# Patient Record
Sex: Female | Born: 1955
Health system: Southern US, Community
[De-identification: ages and names within clinical notes are randomized; demographics above are authoritative.]

## PROBLEM LIST (undated history)

## (undated) DIAGNOSIS — K219 Gastro-esophageal reflux disease without esophagitis: Secondary | ICD-10-CM

## (undated) DIAGNOSIS — F32A Depression, unspecified: Secondary | ICD-10-CM

## (undated) DIAGNOSIS — M1990 Unspecified osteoarthritis, unspecified site: Secondary | ICD-10-CM

## (undated) DIAGNOSIS — F329 Major depressive disorder, single episode, unspecified: Secondary | ICD-10-CM

## (undated) HISTORY — PX: TONSILLECTOMY: SUR1361

## (undated) HISTORY — DX: Unspecified osteoarthritis, unspecified site: M19.90

## (undated) HISTORY — PX: APPENDECTOMY: SHX54

---

## 1998-11-04 ENCOUNTER — Emergency Department (HOSPITAL_COMMUNITY): Admission: EM | Admit: 1998-11-04 | Discharge: 1998-11-04 | Payer: Self-pay | Admitting: Emergency Medicine

## 1998-11-04 ENCOUNTER — Encounter: Payer: Self-pay | Admitting: Emergency Medicine

## 1998-12-19 ENCOUNTER — Encounter: Admission: RE | Admit: 1998-12-19 | Discharge: 1999-03-19 | Payer: Self-pay | Admitting: Orthopedic Surgery

## 2012-01-15 ENCOUNTER — Emergency Department (HOSPITAL_COMMUNITY): Payer: Self-pay

## 2012-01-15 ENCOUNTER — Observation Stay (HOSPITAL_COMMUNITY)
Admission: EM | Admit: 2012-01-15 | Discharge: 2012-01-16 | Disposition: A | Discharge status: Home / Self Care | Payer: Self-pay | Attending: Surgery | Admitting: Surgery

## 2012-01-15 ENCOUNTER — Encounter (HOSPITAL_COMMUNITY): Payer: Self-pay | Admitting: Certified Registered Nurse Anesthetist

## 2012-01-15 ENCOUNTER — Encounter (HOSPITAL_COMMUNITY)
Admission: EM | Disposition: A | Discharge status: Home / Self Care | Payer: Self-pay | Source: Home / Self Care | Attending: Emergency Medicine

## 2012-01-15 ENCOUNTER — Emergency Department (HOSPITAL_COMMUNITY): Payer: Self-pay | Admitting: Certified Registered Nurse Anesthetist

## 2012-01-15 ENCOUNTER — Inpatient Hospital Stay: Admit: 2012-01-15 | Payer: Self-pay | Admitting: Surgery

## 2012-01-15 ENCOUNTER — Encounter (HOSPITAL_COMMUNITY): Payer: Self-pay | Admitting: Emergency Medicine

## 2012-01-15 ENCOUNTER — Encounter (HOSPITAL_COMMUNITY): Payer: Self-pay | Admitting: *Deleted

## 2012-01-15 DIAGNOSIS — F17200 Nicotine dependence, unspecified, uncomplicated: Secondary | ICD-10-CM | POA: Insufficient documentation

## 2012-01-15 DIAGNOSIS — R109 Unspecified abdominal pain: Secondary | ICD-10-CM

## 2012-01-15 DIAGNOSIS — K3580 Unspecified acute appendicitis: Secondary | ICD-10-CM | POA: Diagnosis present

## 2012-01-15 DIAGNOSIS — F329 Major depressive disorder, single episode, unspecified: Secondary | ICD-10-CM | POA: Insufficient documentation

## 2012-01-15 DIAGNOSIS — K219 Gastro-esophageal reflux disease without esophagitis: Secondary | ICD-10-CM | POA: Insufficient documentation

## 2012-01-15 DIAGNOSIS — K35209 Acute appendicitis with generalized peritonitis, without abscess, unspecified as to perforation: Principal | ICD-10-CM | POA: Insufficient documentation

## 2012-01-15 DIAGNOSIS — K352 Acute appendicitis with generalized peritonitis, without abscess: Secondary | ICD-10-CM | POA: Insufficient documentation

## 2012-01-15 DIAGNOSIS — F3289 Other specified depressive episodes: Secondary | ICD-10-CM | POA: Insufficient documentation

## 2012-01-15 HISTORY — PX: LAPAROSCOPIC APPENDECTOMY: SHX408

## 2012-01-15 HISTORY — DX: Depression, unspecified: F32.A

## 2012-01-15 HISTORY — DX: Unspecified acute appendicitis: K35.80

## 2012-01-15 HISTORY — DX: Gastro-esophageal reflux disease without esophagitis: K21.9

## 2012-01-15 HISTORY — DX: Major depressive disorder, single episode, unspecified: F32.9

## 2012-01-15 LAB — COMPREHENSIVE METABOLIC PANEL
AST: 16 U/L (ref 0–37)
Albumin: 3.5 g/dL (ref 3.5–5.2)
CO2: 24 mEq/L (ref 19–32)
Calcium: 9.3 mg/dL (ref 8.4–10.5)
Creatinine, Ser: 0.86 mg/dL (ref 0.50–1.10)
GFR calc non Af Amer: 74 mL/min — ABNORMAL LOW (ref >90)
Total Protein: 6.9 g/dL (ref 6.0–8.3)

## 2012-01-15 LAB — CBC WITH DIFFERENTIAL/PLATELET
Basophils Absolute: 0 10*3/uL (ref 0.0–0.1)
Basophils Relative: 0 % (ref 0–1)
Eosinophils Absolute: 0 10*3/uL (ref 0.0–0.7)
Eosinophils Relative: 0 % (ref 0–5)
HCT: 41.7 % (ref 36.0–46.0)
MCH: 30.1 pg (ref 26.0–34.0)
MCHC: 33.8 g/dL (ref 30.0–36.0)
MCV: 88.9 fL (ref 78.0–100.0)
Monocytes Absolute: 0.8 10*3/uL (ref 0.1–1.0)
RDW: 13.1 % (ref 11.5–15.5)

## 2012-01-15 LAB — URINALYSIS, ROUTINE W REFLEX MICROSCOPIC
Hgb urine dipstick: NEGATIVE
Specific Gravity, Urine: 1.015 (ref 1.005–1.030)
Urobilinogen, UA: 0.2 mg/dL (ref 0.0–1.0)

## 2012-01-15 LAB — POCT PREGNANCY, URINE: Preg Test, Ur: NEGATIVE

## 2012-01-15 SURGERY — APPENDECTOMY, LAPAROSCOPIC
Anesthesia: General | Site: Abdomen | Wound class: Clean Contaminated

## 2012-01-15 MED ORDER — LACTATED RINGERS IV SOLN
INTRAVENOUS | Status: DC | PRN
  Administered 2012-01-15 (×2): via INTRAVENOUS

## 2012-01-15 MED ORDER — HYDROMORPHONE HCL PF 1 MG/ML IJ SOLN
1.0000 mg | Freq: Once | INTRAMUSCULAR | Status: AC
  Administered 2012-01-15: 1 mg via INTRAVENOUS
  Filled 2012-01-15: qty 1

## 2012-01-15 MED ORDER — ONDANSETRON HCL 4 MG/2ML IJ SOLN: 4.0000 mg | Freq: Four times a day (QID) | INTRAMUSCULAR | Status: DC | PRN

## 2012-01-15 MED ORDER — OXYCODONE HCL 5 MG PO TABS: 5.0000 mg | ORAL_TABLET | Freq: Once | ORAL | Status: DC | PRN

## 2012-01-15 MED ORDER — SODIUM CHLORIDE 0.9 % IV SOLN
Freq: Once | INTRAVENOUS | Status: AC
  Administered 2012-01-15: 14:00:00 via INTRAVENOUS

## 2012-01-15 MED ORDER — PAROXETINE HCL 30 MG PO TABS
60.0000 mg | ORAL_TABLET | Freq: Every day | ORAL | Status: DC
  Administered 2012-01-16: 60 mg via ORAL
  Filled 2012-01-15 (×2): qty 2

## 2012-01-15 MED ORDER — EPHEDRINE SULFATE 50 MG/ML IJ SOLN
INTRAMUSCULAR | Status: DC | PRN
  Administered 2012-01-15: 10 mg via INTRAVENOUS

## 2012-01-15 MED ORDER — 0.9 % SODIUM CHLORIDE (POUR BTL) OPTIME
TOPICAL | Status: DC | PRN
  Administered 2012-01-15: 1000 mL

## 2012-01-15 MED ORDER — MORPHINE SULFATE 4 MG/ML IJ SOLN
1.0000 mg | INTRAMUSCULAR | Status: DC | PRN
  Administered 2012-01-15: 4 mg via INTRAVENOUS
  Filled 2012-01-15: qty 1

## 2012-01-15 MED ORDER — MORPHINE SULFATE 4 MG/ML IJ SOLN
4.0000 mg | INTRAMUSCULAR | Status: DC | PRN
  Administered 2012-01-16: 4 mg via INTRAVENOUS
  Filled 2012-01-15: qty 1

## 2012-01-15 MED ORDER — SUCCINYLCHOLINE CHLORIDE 20 MG/ML IJ SOLN
INTRAMUSCULAR | Status: DC | PRN
  Administered 2012-01-15: 120 mg via INTRAVENOUS

## 2012-01-15 MED ORDER — DEXAMETHASONE SODIUM PHOSPHATE 4 MG/ML IJ SOLN
INTRAMUSCULAR | Status: DC | PRN
  Administered 2012-01-15: 4 mg via INTRAVENOUS

## 2012-01-15 MED ORDER — ONDANSETRON HCL 4 MG PO TABS: 4.0000 mg | ORAL_TABLET | Freq: Four times a day (QID) | ORAL | Status: DC | PRN

## 2012-01-15 MED ORDER — SODIUM CHLORIDE 0.9 % IV SOLN
1.0000 g | Freq: Once | INTRAVENOUS | Status: AC
  Administered 2012-01-15 (×2): 1 g via INTRAVENOUS
  Filled 2012-01-15 (×2): qty 1

## 2012-01-15 MED ORDER — OXYCODONE-ACETAMINOPHEN 5-325 MG PO TABS
1.0000 | ORAL_TABLET | ORAL | Status: DC | PRN
  Administered 2012-01-16: 2 via ORAL
  Filled 2012-01-15: qty 2

## 2012-01-15 MED ORDER — HYDROXYZINE HCL 25 MG PO TABS
25.0000 mg | ORAL_TABLET | Freq: Three times a day (TID) | ORAL | Status: DC | PRN
  Administered 2012-01-16 (×2): 25 mg via ORAL
  Filled 2012-01-15 (×3): qty 1

## 2012-01-15 MED ORDER — GLYCOPYRROLATE 0.2 MG/ML IJ SOLN
INTRAMUSCULAR | Status: DC | PRN
  Administered 2012-01-15: 0.4 mg via INTRAVENOUS

## 2012-01-15 MED ORDER — KCL IN DEXTROSE-NACL 20-5-0.45 MEQ/L-%-% IV SOLN
INTRAVENOUS | Status: DC
  Administered 2012-01-16: via INTRAVENOUS
  Filled 2012-01-15 (×2): qty 1000

## 2012-01-15 MED ORDER — BUSPIRONE HCL 10 MG PO TABS
10.0000 mg | ORAL_TABLET | Freq: Three times a day (TID) | ORAL | Status: DC
  Filled 2012-01-15: qty 1

## 2012-01-15 MED ORDER — PAROXETINE HCL 30 MG PO TABS
60.0000 mg | ORAL_TABLET | Freq: Every day | ORAL | Status: DC
  Filled 2012-01-15 (×2): qty 2

## 2012-01-15 MED ORDER — LIDOCAINE HCL 4 % MT SOLN
OROMUCOSAL | Status: DC | PRN
  Administered 2012-01-15: 4 mL via TOPICAL

## 2012-01-15 MED ORDER — KETOROLAC TROMETHAMINE 30 MG/ML IJ SOLN
30.0000 mg | Freq: Four times a day (QID) | INTRAMUSCULAR | Status: DC
  Administered 2012-01-15 – 2012-01-16 (×3): 30 mg via INTRAVENOUS
  Filled 2012-01-15 (×6): qty 1

## 2012-01-15 MED ORDER — BUSPIRONE HCL 10 MG PO TABS
10.0000 mg | ORAL_TABLET | Freq: Three times a day (TID) | ORAL | Status: DC
  Administered 2012-01-16 (×2): 10 mg via ORAL
  Filled 2012-01-15 (×4): qty 1

## 2012-01-15 MED ORDER — SODIUM CHLORIDE 0.9 % IV SOLN
1.0000 g | INTRAVENOUS | Status: DC | PRN
  Administered 2012-01-15: 1 g via INTRAVENOUS

## 2012-01-15 MED ORDER — IOHEXOL 300 MG/ML  SOLN
100.0000 mL | Freq: Once | INTRAMUSCULAR | Status: AC | PRN
  Administered 2012-01-15: 100 mL via INTRAVENOUS

## 2012-01-15 MED ORDER — IOHEXOL 300 MG/ML  SOLN
20.0000 mL | INTRAMUSCULAR | Status: AC
  Administered 2012-01-15 (×2): 20 mL via ORAL

## 2012-01-15 MED ORDER — ONDANSETRON HCL 4 MG/2ML IJ SOLN
4.0000 mg | INTRAMUSCULAR | Status: DC | PRN
  Administered 2012-01-15: 4 mg via INTRAVENOUS
  Filled 2012-01-15: qty 2

## 2012-01-15 MED ORDER — PROMETHAZINE HCL 25 MG/ML IJ SOLN: 6.2500 mg | INTRAMUSCULAR | Status: DC | PRN

## 2012-01-15 MED ORDER — PROPOFOL 10 MG/ML IV BOLUS
INTRAVENOUS | Status: DC | PRN
  Administered 2012-01-15: 150 mg via INTRAVENOUS

## 2012-01-15 MED ORDER — OXYCODONE HCL 5 MG/5ML PO SOLN: 5.0000 mg | Freq: Once | ORAL | Status: DC | PRN

## 2012-01-15 MED ORDER — SODIUM CHLORIDE 0.9 % IR SOLN
Status: DC | PRN
  Administered 2012-01-15: 1000 mL

## 2012-01-15 MED ORDER — NEOSTIGMINE METHYLSULFATE 1 MG/ML IJ SOLN
INTRAMUSCULAR | Status: DC | PRN
  Administered 2012-01-15: 3 mg via INTRAVENOUS

## 2012-01-15 MED ORDER — BUPIVACAINE-EPINEPHRINE 0.25% -1:200000 IJ SOLN
INTRAMUSCULAR | Status: DC | PRN
  Administered 2012-01-15: 9 mL

## 2012-01-15 MED ORDER — SUFENTANIL CITRATE 50 MCG/ML IV SOLN
INTRAVENOUS | Status: DC | PRN
  Administered 2012-01-15 (×2): 10 ug via INTRAVENOUS

## 2012-01-15 MED ORDER — ONDANSETRON HCL 4 MG/2ML IJ SOLN
4.0000 mg | Freq: Once | INTRAMUSCULAR | Status: AC
  Administered 2012-01-15: 4 mg via INTRAVENOUS
  Filled 2012-01-15: qty 2

## 2012-01-15 MED ORDER — MIDAZOLAM HCL 5 MG/5ML IJ SOLN
INTRAMUSCULAR | Status: DC | PRN
  Administered 2012-01-15: 2 mg via INTRAVENOUS

## 2012-01-15 MED ORDER — ACETAMINOPHEN 325 MG PO TABS
650.0000 mg | ORAL_TABLET | ORAL | Status: DC | PRN
  Administered 2012-01-16: 650 mg via ORAL
  Filled 2012-01-15: qty 2

## 2012-01-15 MED ORDER — HYDROMORPHONE HCL PF 1 MG/ML IJ SOLN
0.2500 mg | INTRAMUSCULAR | Status: DC | PRN
  Administered 2012-01-15 (×2): 0.5 mg via INTRAVENOUS

## 2012-01-15 MED ORDER — ONDANSETRON HCL 4 MG/2ML IJ SOLN
INTRAMUSCULAR | Status: DC | PRN
  Administered 2012-01-15: 4 mg via INTRAVENOUS

## 2012-01-15 MED ORDER — ROCURONIUM BROMIDE 100 MG/10ML IV SOLN
INTRAVENOUS | Status: DC | PRN
  Administered 2012-01-15: 20 mg via INTRAVENOUS

## 2012-01-15 MED ORDER — ARTIFICIAL TEARS OP OINT
TOPICAL_OINTMENT | OPHTHALMIC | Status: DC | PRN
  Administered 2012-01-15: 1 via OPHTHALMIC

## 2012-01-15 SURGICAL SUPPLY — 48 items
APL SKNCLS STERI-STRIP NONHPOA (GAUZE/BANDAGES/DRESSINGS) ×1
APPLIER CLIP ROT 10 11.4 M/L (STAPLE)
APR CLP MED LRG 11.4X10 (STAPLE)
BAG SPEC RTRVL LRG 6X4 10 (ENDOMECHANICALS) ×1
BENZOIN TINCTURE PRP APPL 2/3 (GAUZE/BANDAGES/DRESSINGS) ×2 IMPLANT
BLADE SURG ROTATE 9660 (MISCELLANEOUS) IMPLANT
CANISTER SUCTION 2500CC (MISCELLANEOUS) ×2 IMPLANT
CHLORAPREP W/TINT 26ML (MISCELLANEOUS) ×2 IMPLANT
CLIP APPLIE ROT 10 11.4 M/L (STAPLE) IMPLANT
CLOTH BEACON ORANGE TIMEOUT ST (SAFETY) ×2 IMPLANT
CLSR STERI-STRIP ANTIMIC 1/2X4 (GAUZE/BANDAGES/DRESSINGS) ×1 IMPLANT
COVER SURGICAL LIGHT HANDLE (MISCELLANEOUS) ×2 IMPLANT
CUTTER FLEX LINEAR 45M (STAPLE) ×2 IMPLANT
DECANTER SPIKE VIAL GLASS SM (MISCELLANEOUS) ×2 IMPLANT
DRAPE UTILITY 15X26 W/TAPE STR (DRAPE) ×4 IMPLANT
DRSG TEGADERM 4X4.75 (GAUZE/BANDAGES/DRESSINGS) ×1 IMPLANT
ELECT REM PT RETURN 9FT ADLT (ELECTROSURGICAL) ×2
ELECTRODE REM PT RTRN 9FT ADLT (ELECTROSURGICAL) ×1 IMPLANT
ENDOLOOP SUT PDS II  0 18 (SUTURE)
ENDOLOOP SUT PDS II 0 18 (SUTURE) IMPLANT
FILTER SMOKE EVAC LAPAROSHD (FILTER) ×2 IMPLANT
GAUZE SPONGE 2X2 8PLY STRL LF (GAUZE/BANDAGES/DRESSINGS) ×1 IMPLANT
GLOVE BIO SURGEON STRL SZ7 (GLOVE) ×2 IMPLANT
GLOVE BIO SURGEON STRL SZ7.5 (GLOVE) ×1 IMPLANT
GLOVE BIOGEL PI IND STRL 7.5 (GLOVE) ×1 IMPLANT
GLOVE BIOGEL PI IND STRL 8 (GLOVE) IMPLANT
GLOVE BIOGEL PI INDICATOR 7.5 (GLOVE) ×1
GLOVE BIOGEL PI INDICATOR 8 (GLOVE) ×1
GOWN STRL NON-REIN LRG LVL3 (GOWN DISPOSABLE) ×6 IMPLANT
KIT BASIN OR (CUSTOM PROCEDURE TRAY) ×2 IMPLANT
KIT ROOM TURNOVER OR (KITS) ×2 IMPLANT
NS IRRIG 1000ML POUR BTL (IV SOLUTION) ×2 IMPLANT
PAD ARMBOARD 7.5X6 YLW CONV (MISCELLANEOUS) ×4 IMPLANT
POUCH SPECIMEN RETRIEVAL 10MM (ENDOMECHANICALS) ×2 IMPLANT
RELOAD STAPLE 45 3.5 BLU ETS (ENDOMECHANICALS) ×1 IMPLANT
RELOAD STAPLE TA45 3.5 REG BLU (ENDOMECHANICALS) ×2 IMPLANT
SCALPEL HARMONIC ACE (MISCELLANEOUS) ×2 IMPLANT
SET IRRIG TUBING LAPAROSCOPIC (IRRIGATION / IRRIGATOR) ×2 IMPLANT
SPECIMEN JAR SMALL (MISCELLANEOUS) ×2 IMPLANT
SPONGE GAUZE 2X2 STER 10/PKG (GAUZE/BANDAGES/DRESSINGS) ×1
SUT MNCRL AB 4-0 PS2 18 (SUTURE) ×2 IMPLANT
TOWEL OR 17X24 6PK STRL BLUE (TOWEL DISPOSABLE) ×2 IMPLANT
TOWEL OR 17X26 10 PK STRL BLUE (TOWEL DISPOSABLE) ×2 IMPLANT
TRAY FOLEY CATH 14FR (SET/KITS/TRAYS/PACK) ×2 IMPLANT
TRAY LAPAROSCOPIC (CUSTOM PROCEDURE TRAY) ×2 IMPLANT
TROCAR XCEL BLADELESS 5X75MML (TROCAR) ×4 IMPLANT
TROCAR XCEL BLUNT TIP 100MML (ENDOMECHANICALS) ×2 IMPLANT
WATER STERILE IRR 1000ML POUR (IV SOLUTION) IMPLANT

## 2012-01-15 NOTE — ED Provider Notes (Signed)
Patient care assumed from [facility], [place]. Patient moved to CDU awaiting CT scan abdomen/pelvis. Currently patient in room laying in bed stating her pain is 5/10. Denies nausea. Already drank contrast for CT scan. On exam patient is laying in bed and appears uncomfortable. HENT negative. Heart RRR. Lungs CTA bilateral. Abdomen soft with normal bowel sounds, TTP of right side of abdomen, worse in RLQ. Positive guarding. No peritoneal signs. AAOx3.  Phone call from radiologist stating patient either has acute appendicitis or inguinal hernia with small bowel through non obstructed. Surgery will be consulted.  Ct Abdomen Pelvis W Contrast  01/15/2012  *RADIOLOGY REPORT*  Clinical Data: Generalized abdominal pain  CT ABDOMEN AND PELVIS WITH CONTRAST  Technique:  Multidetector CT imaging of the abdomen and pelvis was performed following the standard protocol during bolus administration of intravenous contrast.  Contrast: OMNIPAQUE IOHEXOL 300 MG/ML  SOLN  Comparison: None.  Findings: Minimal dependent atelectasis at the lung bases.  Liver, spleen, pancreas, and adrenal glands are within normal limits.  Gallbladder is unremarkable.  No intrahepatic or extrahepatic ductal dilatation.  3 mm nonobstructing right lower pole renal calculus (series 2/image 38).  4.2 x 3.3 cm interpolar left renal cyst (series 2/image 38). No hydronephrosis.  No evidence of bowel obstruction. Appendix is mildly prominent, measuring up to 9 mm (series 2/image 65).  Suspected mild mucosal hyperenhancement with possible very early periappendiceal inflammatory changes (coronal image 27).  While not definitive, this appearance is worrisome for early acute appendicitis.  Atherosclerotic calcifications of the abdominal aorta and branch vessels.  No bone pelvic ascites.  No suspicious abdominopelvic lymphadenopathy.  Uterus and bilateral ovaries unremarkable.  Bladder is within normal limits.  Small right inguinal hernia containing a  knuckle of nondilated small bowel (series 2/image 77).  Mild degenerative changes of the visualized thoracolumbar spine. Grade 1 spondylolisthesis at L5-S1.  IMPRESSION: Findings worrisome for early acute appendicitis.  Small right inguinal hernia containing a knuckle of nondilated small bowel.  No evidence of bowel obstruction.   Original Report Authenticated By: Charline Bills, M.D.    Case discussed with Doran Durand, PA-C who will take over care of patient at this time.  Trevor Mace, PA-C 01/15/12 1551

## 2012-01-15 NOTE — ED Notes (Signed)
Pt c/o generalized abd pain and back pain x 2 days; pt sts shaking and chills

## 2012-01-15 NOTE — ED Provider Notes (Signed)
Patient has been seen and evaluated by surgery.  Patient will go to OR tonight.  Jimmye Norman, NP 01/15/12 2240

## 2012-01-15 NOTE — H&P (Signed)
Shirley Williams 01-31-1956  [id number].   Primary Care MD: Physician in [place] Chief Complaint/Reason for Consult: abdominal pain HPI: this is a 56 yo female who began having RLQ abdominal pain last night around 2000.  It started in the RLQ and doesn't radiate anywhere.  She denies any other symptoms except pain.  No nausea, vomiting, diarrhea, chills, fevers, chest pain, or SOB.  The only other complaint she has is a headache.  Due to pain, she came to the [facility] today where she had a CT scan that revealed early appendicitis.    Review of Systems: Please see HPI, otherwise all other systems are negative  History reviewed. No pertinent family history.  Past Medical History  Diagnosis Date  . Depression   . GERD (gastroesophageal reflux disease)     Past Surgical History  Procedure Date  . Tonsillectomy     Social History:  reports that she has been smoking.  She does not have any smokeless tobacco history on file. She reports that she drinks alcohol. She reports that she does not use illicit drugs.  Allergies:  Allergies  Allergen Reactions  . Sulfa Antibiotics      (Not in a hospital admission)  Blood pressure 108/68, pulse 71, temperature 98.4 F (36.9 C), temperature source Oral, resp. rate 18, last menstrual period 12/04/2011, SpO2 99.00%. Physical Exam: General: WD, WN white female who is laying in bed in NAD HEENT: head is normocephalic, atraumatic.  Sclera are noninjected.  PERRL.  Ears and nose without any masses or lesions.  Mouth is pink and moist Heart: regular, rate, and rhythm.  Normal s1,s2. No obvious murmurs, gallops, or rubs noted.  Palpable radial and pedal pulses bilaterally Lungs: CTAB, no wheezes, rhonchi, or rales noted.  Respiratory effort nonlabored Abd: soft, tender in RLQ over McBurney's point, ND, +BS, no masses, organomegaly.  Right inguinal hernia must be reduced as I am unable to currently palpate it. MS: all 4 extremities are symmetrical with no  cyanosis, clubbing, or edema. Skin: warm and dry with no masses, lesions, or rashes Psych: A&Ox3 with a flighty disposition    Results for orders placed during the hospital encounter of 01/15/12 (from the past 48 hour(s))  CBC WITH DIFFERENTIAL     Status: Abnormal   Collection Time   01/15/12 12:39 PM      Component Value Range Comment   WBC 14.0 (*) 4.0 - 10.5 K/uL    RBC 4.69  3.87 - 5.11 MIL/uL    Hemoglobin 14.1  12.0 - 15.0 g/dL    HCT 08.6  57.8 - 46.9 %    MCV 88.9  78.0 - 100.0 fL    MCH 30.1  26.0 - 34.0 pg    MCHC 33.8  30.0 - 36.0 g/dL    RDW 62.9  52.8 - 41.3 %    Platelets 204  150 - 400 K/uL    Neutrophils Relative 90 (*) 43 - 77 %    Neutro Abs 12.5 (*) 1.7 - 7.7 K/uL    Lymphocytes Relative 4 (*) 12 - 46 %    Lymphs Abs 0.6 (*) 0.7 - 4.0 K/uL    Monocytes Relative 6  3 - 12 %    Monocytes Absolute 0.8  0.1 - 1.0 K/uL    Eosinophils Relative 0  0 - 5 %    Eosinophils Absolute 0.0  0.0 - 0.7 K/uL    Basophils Relative 0  0 - 1 %    Basophils Absolute  0.0  0.0 - 0.1 K/uL   COMPREHENSIVE METABOLIC PANEL     Status: Abnormal   Collection Time   01/15/12 12:39 PM      Component Value Range Comment   Sodium 138  135 - 145 mEq/L    Potassium 3.6  3.5 - 5.1 mEq/L    Chloride 104  96 - 112 mEq/L    CO2 24  19 - 32 mEq/L    Glucose, Bld 124 (*) 70 - 99 mg/dL    BUN 14  6 - 23 mg/dL    Creatinine, Ser 1.61  0.50 - 1.10 mg/dL    Calcium 9.3  8.4 - 09.6 mg/dL    Total Protein 6.9  6.0 - 8.3 g/dL    Albumin 3.5  3.5 - 5.2 g/dL    AST 16  0 - 37 U/L    ALT 10  0 - 35 U/L    Alkaline Phosphatase 59  39 - 117 U/L    Total Bilirubin 0.6  0.3 - 1.2 mg/dL    GFR calc non Af Amer 74 (*) >90 mL/min    GFR calc Af Amer 85 (*) >90 mL/min   URINALYSIS, ROUTINE W REFLEX MICROSCOPIC     Status: Normal   Collection Time   01/15/12  1:03 PM      Component Value Range Comment   Color, Urine YELLOW  YELLOW    APPearance CLEAR  CLEAR    Specific Gravity, Urine 1.015  1.005 -  1.030    pH 6.0  5.0 - 8.0    Glucose, UA NEGATIVE  NEGATIVE mg/dL    Hgb urine dipstick NEGATIVE  NEGATIVE    Bilirubin Urine NEGATIVE  NEGATIVE    Ketones, ur NEGATIVE  NEGATIVE mg/dL    Protein, ur NEGATIVE  NEGATIVE mg/dL    Urobilinogen, UA 0.2  0.0 - 1.0 mg/dL    Nitrite NEGATIVE  NEGATIVE    Leukocytes, UA NEGATIVE  NEGATIVE MICROSCOPIC NOT DONE ON URINES WITH NEGATIVE PROTEIN, BLOOD, LEUKOCYTES, NITRITE, OR GLUCOSE <1000 mg/dL.  POCT PREGNANCY, URINE     Status: Normal   Collection Time   01/15/12  1:08 PM      Component Value Range Comment   Preg Test, Ur NEGATIVE  NEGATIVE    Ct Abdomen Pelvis W Contrast  01/15/2012  *RADIOLOGY REPORT*  Clinical Data: Generalized abdominal pain  CT ABDOMEN AND PELVIS WITH CONTRAST  Technique:  Multidetector CT imaging of the abdomen and pelvis was performed following the standard protocol during bolus administration of intravenous contrast.  Contrast: OMNIPAQUE IOHEXOL 300 MG/ML  SOLN  Comparison: None.  Findings: Minimal dependent atelectasis at the lung bases.  Liver, spleen, pancreas, and adrenal glands are within normal limits.  Gallbladder is unremarkable.  No intrahepatic or extrahepatic ductal dilatation.  3 mm nonobstructing right lower pole renal calculus (series 2/image 38).  4.2 x 3.3 cm interpolar left renal cyst (series 2/image 38). No hydronephrosis.  No evidence of bowel obstruction. Appendix is mildly prominent, measuring up to 9 mm (series 2/image 65).  Suspected mild mucosal hyperenhancement with possible very early periappendiceal inflammatory changes (coronal image 27).  While not definitive, this appearance is worrisome for early acute appendicitis.  Atherosclerotic calcifications of the abdominal aorta and branch vessels.  No bone pelvic ascites.  No suspicious abdominopelvic lymphadenopathy.  Uterus and bilateral ovaries unremarkable.  Bladder is within normal limits.  Small right inguinal hernia containing a knuckle of  nondilated small bowel (series  2/image 77).  Mild degenerative changes of the visualized thoracolumbar spine. Grade 1 spondylolisthesis at L5-S1.  IMPRESSION: Findings worrisome for early acute appendicitis.  Small right inguinal hernia containing a knuckle of nondilated small bowel.  No evidence of bowel obstruction.   Original Report Authenticated By: Charline Bills, M.D.        Assessment/Plan 1. Early acute appendicitis 2. Right inguinal hernia, reduced 3. Major Depression  Plan: 1. The patient will be admitted and taken to the operating room for an appendectomy.  Her RIH appears to be reduced and she is nontender in this area.  She is acutely tender over McBurney's point, c/w acute appendicitis as opposed to pain secondary to this hernia.  She will be given Invanz.  I have explained the procedure in detail, as well as possible risks and complications, including bleeding, infection, abscess, or open procedure. I also discussed her expected course for recovery.  Younique Casad E 01/15/2012, 4:21 PM Pager: [phone]

## 2012-01-15 NOTE — Op Note (Signed)
Appendectomy, Lap, Procedure Note  Indications: The patient presented with a history of right-sided abdominal pain. A CT scan revealed findings consistent with acute appendicitis, but no sign of perforation.  Pre-operative Diagnosis: Acute appendicitis with generalized peritonitis  Post-operative Diagnosis: Same  Surgeon: Braden Deloach K.   Assistants: none  Anesthesia: General endotracheal anesthesia  ASA Class: 2E  Procedure Details  The patient was seen again in the Holding Room. The risks, benefits, complications, treatment options, and expected outcomes were discussed with the patient and/or family. The possibilities of reaction to medication, perforation of viscus, bleeding, recurrent infection, finding a normal appendix, the need for additional procedures, failure to diagnose a condition, and creating a complication requiring transfusion or operation were discussed. There was concurrence with the proposed plan and informed consent was obtained. The site of surgery was properly noted. The patient was taken to Operating Room, identified as Shirley Williams and the procedure verified as Appendectomy. A Time Out was held and the above information confirmed.  The patient was placed in the supine position and general anesthesia was induced.  The abdomen was prepped and draped in a sterile fashion. A one centimeter supraumbilical incision was made.  Dissection was carried down to the fascia bluntly.  The fascia was incised vertically.  We entered the peritoneal cavity bluntly.  A pursestring suture was passed around the incision with a 0 Vicryl.  The Hasson cannula was introduced into the abdomen and the tails of the suture were used to hold the Hasson in place.   The pneumoperitoneum was then established maintaining a maximum pressure of 15 mmHg.  Additional 5 mm cannulas then placed in the left lower quadrant of the abdomen and the right upper quadrant under direct visualization. A careful  evaluation of the entire abdomen was carried out. The patient was placed in Trendelenburg and left lateral decubitus position.  The scope was moved to the right upper quadrant port site. The cecum was mobilized medially.  The appendix was identified and was inflamed at the tip, but there was no sign of perforation.  The base of the appendix appeared normal. The appendix was carefully dissected. The appendix was was skeletonized with the harmonic scalpel.   The appendix was divided at its base using an endo-GIA stapler. No appendiceal stump was left in place. There was no evidence of bleeding, leakage, or complication after division of the appendix. Irrigation was also performed and irrigate suctioned from the abdomen as well.  We examined the right side of the pelvis.  The CT scan had commented on a right inguinal hernia containing small bowel, but there was no sign of any small bowel incarceration.  There was a very small shallow inguinal defect medial to the inferior epigastric artery, but no sign of incarceration.  The umbilical port site was closed with the purse string suture. There was no residual palpable fascial defect.  The trocar site skin wounds were closed with 4-0 Monocryl.  Instrument, sponge, and needle counts were correct at the conclusion of the case.   Findings: The appendix was found to be inflamed. There were not signs of necrosis.  There was not perforation. There was not abscess formation.  Estimated Blood Loss:  Minimal         Drains: none         Specimens: appendix         Complications:  None; patient tolerated the procedure well.         Disposition: PACU - hemodynamically stable.  Condition: stable  Wilmon Arms. Corliss Skains, MD, [facility]  01/15/2012 8:10 PM

## 2012-01-15 NOTE — ED Provider Notes (Signed)
History     CSN: [id number]  Arrival date & time 01/15/12  1051   First MD Initiated Contact with Patient 01/15/12 1150      Chief Complaint  Patient presents with  . Abdominal Pain  . Back Pain    (Consider location/radiation/quality/duration/timing/severity/associated sxs/prior treatment) HPI Comments: Patient with no history of abdominal surgeries presents with complaint of generalized abdominal pain, shaking, myalgias for the past 2-3 days. The abdominal pain is described as cramping that is worse in the lower abdomen. It is not associated with vaginal bleeding or discharge. No urinary symptoms. Patient is 56 years old however she states that she still has irregular periods with spotting at times. She denies fever, cold symptoms, shortness of breath, cough, nausea, vomiting, or diarrhea. Patient states she took one of her husband's Vicodin last night to help her sleep. Onset gradual. Course is constant. Pain does not radiate.   Patient is a 56 y.o. female presenting with abdominal pain and back pain. The history is provided by the patient.  Abdominal Pain The primary symptoms of the illness include abdominal pain. The primary symptoms of the illness do not include fever, shortness of breath, nausea, vomiting, diarrhea, dysuria, vaginal discharge or vaginal bleeding.  Additional symptoms associated with the illness include back pain.  Back Pain  Associated symptoms include headaches and abdominal pain. Pertinent negatives include no chest pain, no fever and no dysuria.    Past Medical History  Diagnosis Date  . Depression     History reviewed. No pertinent past surgical history.  History reviewed. No pertinent family history.  History  Substance Use Topics  . Smoking status: Current Every Day Smoker  . Smokeless tobacco: Not on file  . Alcohol Use: Yes     Comment: occ    OB History    Grav Para Term Preterm Abortions TAB SAB Ect Mult Living                   Review of Systems  Constitutional: Negative for fever.  HENT: Negative for sore throat and rhinorrhea.   Eyes: Negative for redness.  Respiratory: Negative for cough and shortness of breath.   Cardiovascular: Negative for chest pain.  Gastrointestinal: Positive for abdominal pain. Negative for nausea, vomiting and diarrhea.  Genitourinary: Negative for dysuria, vaginal bleeding and vaginal discharge.  Musculoskeletal: Positive for myalgias and back pain.  Skin: Negative for rash.  Neurological: Positive for tremors and headaches.    Allergies  Sulfa antibiotics  Home Medications   Current Outpatient Rx  Name  Route  Sig  Dispense  Refill  . GLUCOSAMINE COMPLEX PO   Oral   Take 1 tablet by mouth daily.         . BUSPIRONE HCL 10 MG PO TABS   Oral   Take 10 mg by mouth 3 (three) times daily.         Marland Kitchen CALCIUM-MAGNESIUM-ZINC PO   Oral   Take 1 tablet by mouth daily.         Marland Kitchen FLUTICASONE FUROATE 27.5 MCG/SPRAY NA SUSP   Nasal   Place 2 sprays into the nose daily as needed. For nasal congestion         . FOLIC ACID 1 MG PO TABS   Oral   Take 1 mg by mouth daily.         Marland Kitchen HYDROXYZINE HCL 25 MG PO TABS   Oral   Take 25 mg by mouth 3 (three) times daily  as needed. For nerves         . ALLERGY EYE DROPS OP   Ophthalmic   Apply 1-2 drops to eye every 2 (two) hours as needed. For dry eyes         . ADULT MULTIVITAMIN W/MINERALS CH   Oral   Take 1 tablet by mouth daily.         Marland Kitchen PAROXETINE HCL 20 MG PO TABS   Oral   Take 60 mg by mouth daily.         . TRAZODONE HCL 100 MG PO TABS   Oral   Take 100 mg by mouth at bedtime.         Marland Kitchen VITAMIN A PO   Oral   Take 1 capsule by mouth daily.           BP 117/75  Pulse 97  Temp 98.7 F (37.1 C) (Oral)  Resp 18  SpO2 97%  Physical Exam  Nursing note and vitals reviewed. Constitutional: She appears well-developed and well-nourished.  HENT:  Head: Normocephalic and atraumatic.   Eyes: Conjunctivae normal are normal. Right eye exhibits no discharge. Left eye exhibits no discharge.  Neck: Normal range of motion. Neck supple.  Cardiovascular: Normal rate, regular rhythm and normal heart sounds.   Pulmonary/Chest: Effort normal and breath sounds normal.  Abdominal: Soft. Bowel sounds are normal. There is generalized tenderness. There is no rigidity, no rebound, no guarding, no CVA tenderness, no tenderness at McBurney's point and negative Murphy's sign.       Abd pain, favors lower right abd.   Neurological: She is alert.  Skin: Skin is warm and dry.  Psychiatric: She has a normal mood and affect.    ED Course  Procedures (including critical care time)  Labs Reviewed  CBC WITH DIFFERENTIAL - Abnormal; Notable for the following:    WBC 14.0 (*)     Neutrophils Relative 90 (*)     Neutro Abs 12.5 (*)     Lymphocytes Relative 4 (*)     Lymphs Abs 0.6 (*)     All other components within normal limits  COMPREHENSIVE METABOLIC PANEL - Abnormal; Notable for the following:    Glucose, Bld 124 (*)     GFR calc non Af Amer 74 (*)     GFR calc Af Amer 85 (*)     All other components within normal limits  URINALYSIS, ROUTINE W REFLEX MICROSCOPIC  POCT PREGNANCY, URINE   No results found.   No diagnosis found.  12:26 PM Patient seen and examined. Work-up initiated. Medications ordered.   Vital signs reviewed and are as follows: Filed Vitals:   01/15/12 1106  BP: 117/75  Pulse: 97  Temp: 98.7 F (37.1 C)  Resp: 18   2:02 PM Patient seen. Pain improved. Still has RLQ tenderness on exam. Will move to the CDU. CT abd/pelvis ordered. Handoff to [employer] who will dispo per results.    MDM  To CDU pending CT abd/pelvis.         Renne Crigler, [place] 01/15/12 1407

## 2012-01-15 NOTE — Transfer of Care (Signed)
Immediate Anesthesia Transfer of Care Note  Patient: Shirley Williams  Procedure(s) Performed: Procedure(s) (LRB) with comments: APPENDECTOMY LAPAROSCOPIC (N/A)  Patient Location: PACU  Anesthesia Type:General  Level of Consciousness: awake, alert , oriented and patient cooperative  Airway & Oxygen Therapy: Patient Spontanous Breathing and Patient connected to nasal cannula oxygen  Post-op Assessment: Report given to PACU RN, Post -op Vital signs reviewed and stable and Patient moving all extremities X 4  Post vital signs: Reviewed and stable  Complications: No apparent anesthesia complications

## 2012-01-15 NOTE — Anesthesia Preprocedure Evaluation (Signed)
Anesthesia Evaluation  Patient identified by MRN, date of birth, ID band Patient awake    Reviewed: Allergy & Precautions, H&P , NPO status , Patient's Chart, lab work & pertinent test results  History of Anesthesia Complications Negative for: history of anesthetic complications  Airway Mallampati: II TM Distance: >3 FB Neck ROM: Full    Dental  (+) Loose   Pulmonary Current Smoker,    Pulmonary exam normal       Cardiovascular negative cardio ROS      Neuro/Psych Depression    GI/Hepatic Neg liver ROS, GERD-  Medicated,  Endo/Other  negative endocrine ROS  Renal/GU negative Renal ROS     Musculoskeletal   Abdominal   Peds  Hematology   Anesthesia Other Findings   Reproductive/Obstetrics                           Anesthesia Physical Anesthesia Plan  ASA: II and emergent  Anesthesia Plan: General   Post-op Pain Management:    Induction: Intravenous and Rapid sequence  Airway Management Planned: Oral ETT  Additional Equipment:   Intra-op Plan:   Post-operative Plan: Extubation in OR  Informed Consent: I have reviewed the patients History and Physical, chart, labs and discussed the procedure including the risks, benefits and alternatives for the proposed anesthesia with the patient or authorized representative who has indicated his/her understanding and acceptance.   Dental advisory given  Plan Discussed with: CRNA, Anesthesiologist and Surgeon  Anesthesia Plan Comments:         Anesthesia Quick Evaluation

## 2012-01-15 NOTE — H&P (Signed)
Agree with above.  Shirley Williams. Corliss Skains, MD, [facility]  01/15/2012 6:05 PM

## 2012-01-15 NOTE — Progress Notes (Signed)
1825  WAS INSTRUCTED BY DR Harlon Flor TO GO AHEAD AND HANG THE INVANZ....DA

## 2012-01-15 NOTE — ED Notes (Signed)
Pt c/o 10/10 throbbing intermittent pain at lower abdomen radiating to lower back. Pt a&Ox4. Family at bedside.

## 2012-01-15 NOTE — ED Provider Notes (Signed)
Medical screening examination/treatment/procedure(s) were performed by non-physician practitioner and as supervising physician I was immediately available for consultation/collaboration.   Dione Booze, MD 01/15/12 [phone]

## 2012-01-15 NOTE — ED Notes (Signed)
Pt brought to room via wheelchair; pt undressed and in gown; warm blanket given

## 2012-01-15 NOTE — Anesthesia Postprocedure Evaluation (Signed)
Anesthesia Post Note  Patient: Shirley Williams  Procedure(s) Performed: Procedure(s) (LRB): APPENDECTOMY LAPAROSCOPIC (N/A)  Anesthesia type: general  Patient location: PACU  Post pain: Pain level controlled  Post assessment: Patient's Cardiovascular Status Stable  Last Vitals:  Filed Vitals:   01/15/12 2033  BP: 109/64  Pulse: 91  Temp:   Resp: 13    Post vital signs: Reviewed and stable  Level of consciousness: sedated  Complications: No apparent anesthesia complications

## 2012-01-16 ENCOUNTER — Encounter (HOSPITAL_COMMUNITY): Payer: Self-pay | Admitting: Surgery

## 2012-01-16 MED ORDER — OXYCODONE-ACETAMINOPHEN 5-325 MG PO TABS: 1.0000 | ORAL_TABLET | ORAL | Status: DC | PRN

## 2012-01-16 NOTE — Discharge Instructions (Signed)
[facility] ______[facility] °LAPAROSCOPIC SURGERY: POST OP INSTRUCTIONS °Always review your discharge instruction sheet given to you by the facility where your surgery was performed. °IF YOU HAVE DISABILITY OR FAMILY LEAVE FORMS, YOU MUST BRING THEM TO THE OFFICE FOR PROCESSING.   °DO NOT GIVE THEM TO YOUR DOCTOR. ° °1. A prescription for pain medication may be given to you upon discharge.  Take your pain medication as prescribed, if needed.  If narcotic pain medicine is not needed, then you may take acetaminophen (Tylenol) or ibuprofen (Advil) as needed. °2. Take your usually prescribed medications unless otherwise directed. °3. If you need a refill on your pain medication, please contact your pharmacy.  They will contact our office to request authorization. Prescriptions will not be filled after 5pm or on week-ends. °4. You should follow a light diet the first few days after arrival home, such as soup and crackers, etc.  Be sure to include lots of fluids daily. °5. Most patients will experience some swelling and bruising in the area of the incisions.  Ice packs will help.  Swelling and bruising can take several days to resolve.  °6. It is common to experience some constipation if taking pain medication after surgery.  Increasing fluid intake and taking a stool softener (such as Colace) will usually help or prevent this problem from occurring.  A mild laxative (Milk of Magnesia or Miralax) should be taken according to package instructions if there are no bowel movements after 48 hours. °7. Unless discharge instructions indicate otherwise, you may remove your bandages 24-48 hours after surgery, and you may shower at that time.  You may have steri-strips (small skin tapes) in place directly over the incision.  These strips should be left on the skin for 7-10 days.  If your surgeon used skin glue on the incision, you may shower in 24 hours.  The glue will flake off over the next 2-3 weeks.  Any sutures or  staples will be removed at the office during your follow-up visit. °8. ACTIVITIES:  You may resume regular (light) daily activities beginning the next day--such as daily self-care, walking, climbing stairs--gradually increasing activities as tolerated.  You may have sexual intercourse when it is comfortable.  Refrain from any heavy lifting or straining until approved by your doctor. °a. You may drive when you are no longer taking prescription pain medication, you can comfortably wear a seatbelt, and you can safely maneuver your car and apply brakes. °b. RETURN TO WORK:  __________________________________________________________ °9. You should see your doctor in the office for a follow-up appointment approximately 2-3 weeks after your surgery.  Make sure that you call for this appointment within a day or two after you arrive home to insure a convenient appointment time. °10. OTHER INSTRUCTIONS: __________________________________________________________________________________________________________________________ __________________________________________________________________________________________________________________________ °WHEN TO CALL YOUR DOCTOR: °1. Fever over 101.0 °2. Inability to urinate °3. Continued bleeding from incision. °4. Increased pain, redness, or drainage from the incision. °5. Increasing abdominal pain ° °The clinic staff is available to answer your questions during regular business hours.  Please don’t hesitate to call and ask to speak to one of the nurses for clinical concerns.  If you have a medical emergency, go to the nearest emergency room or call 911.  A surgeon from [facility] is always on call at the hospital. °[address], [address], [place], [place]  [address] ? [address] [address], [place],    [address] °[phone] ? [phone] ? FAX [phone] °Web site:   [email or web address] °

## 2012-01-16 NOTE — ED Provider Notes (Signed)
Medical screening examination/treatment/procedure(s) were performed by non-physician practitioner and as supervising physician I was immediately available for consultation/collaboration.  Makyia Erxleben, MD 01/16/12 0713 

## 2012-01-16 NOTE — Progress Notes (Signed)
Discharge instructions gone over with patient. Prescription given. Follow up appointment is made. Home medications gone over. Diet, activity, and incisional care gone over. Sign and symptoms of infection gone over. Patient verbalized understanding of all.

## 2012-01-16 NOTE — ED Provider Notes (Signed)
Medical screening examination/treatment/procedure(s) were performed by non-physician practitioner and as supervising physician I was immediately available for consultation/collaboration.  Cheri Guppy, MD 01/16/12 [phone]

## 2012-01-16 NOTE — Discharge Summary (Signed)
Physician Discharge Summary  Patient ID: Shirley Williams MRN: [id number] DOB/AGE: [birth year] 57 y.o.  Admit date: 01/15/2012 Discharge date: 01/16/2012  Admitting Diagnosis: Acute Appendicitis  Discharge Diagnosis Acute Appendicitis  Consultants None  Procedures Laparoscopic Appendectomy  Hospital Course 56 yr old female who presented to [facility] with abdominal pain.  Workup showed appendicitis.  Patient was admitted and underwent procedure listed above.  Tolerated procedure well and was transferred to the floor.  Diet was advanced as tolerated.  On POD#1, the patient was voiding well, tolerating diet, ambulating well, pain well controlled, vital signs stable, incisions c/d/i and felt stable for discharge home.  Patient will follow up in our office in 2-3 weeks and knows to call with questions or concerns.    Medication List     As of 01/16/2012  8:25 AM    TAKE these medications         ALLERGY EYE DROPS OP   Apply 1-2 drops to eye every 2 (two) hours as needed. For dry eyes      busPIRone 10 MG tablet   Commonly known as: BUSPAR   Take 10 mg by mouth 3 (three) times daily.      CALCIUM-MAGNESIUM-ZINC PO   Take 1 tablet by mouth daily.      fluticasone 27.5 MCG/SPRAY nasal spray   Commonly known as: VERAMYST   Place 2 sprays into the nose daily as needed. For nasal congestion      folic acid 1 MG tablet   Commonly known as: FOLVITE   Take 1 mg by mouth daily.      GLUCOSAMINE COMPLEX PO   Take 1 tablet by mouth daily.      hydrOXYzine 25 MG tablet   Commonly known as: ATARAX/VISTARIL   Take 25 mg by mouth 3 (three) times daily as needed. For nerves      multivitamin with minerals Tabs   Take 1 tablet by mouth daily.      oxyCODONE-acetaminophen 5-325 MG per tablet   Commonly known as: PERCOCET/ROXICET   Take 1-2 tablets by mouth every 4 (four) hours as needed.      PARoxetine 20 MG tablet   Commonly known as: PAXIL   Take 60 mg by mouth daily.      traZODone  100 MG tablet   Commonly known as: DESYREL   Take 100 mg by mouth at bedtime.      VITAMIN A PO   Take 1 capsule by mouth daily.             Follow-up Information    Follow up with [facility] On 02/05/2012. (Arrive at 2:15 for 2:30 appointment)    Contact information:   [address] [place] [address] [phone]         Signed: Denny Levy [facility] [phone]  01/16/2012, 8:25 AM

## 2012-02-05 ENCOUNTER — Encounter (INDEPENDENT_AMBULATORY_CARE_PROVIDER_SITE_OTHER): Payer: Self-pay | Admitting: General Surgery

## 2012-02-05 ENCOUNTER — Ambulatory Visit (INDEPENDENT_AMBULATORY_CARE_PROVIDER_SITE_OTHER): Payer: Self-pay | Admitting: General Surgery

## 2012-02-05 VITALS — BP 102/64 | HR 84 | Temp 97.2°F | Resp 16 | Ht 63.0 in | Wt 160.4 lb

## 2012-02-05 DIAGNOSIS — Z9889 Other specified postprocedural states: Secondary | ICD-10-CM

## 2012-02-05 DIAGNOSIS — K3580 Unspecified acute appendicitis: Secondary | ICD-10-CM

## 2012-02-05 NOTE — Progress Notes (Signed)
Shirley Williams 1955-04-14 [id number] 02/05/2012   History of Present Illness: Shirley Williams is a  56 y.o. female who presents today status post lap appy by Dr. Corliss Skains.  Pathology reveals mild acute appendicitis.  The patient is tolerating a regular diet, having normal bowel movements, has good pain control.  She  is back to most normal activities.   Physical Exam: Abd: soft, nontender, active bowel sounds, nondistended.  All incisions are well healed.  Impression: 1.  Acute appendicitis, s/p lap appy  Plan: She  is able to return to normal activities. She  may follow up on a prn basis.

## 2012-02-05 NOTE — Patient Instructions (Signed)
Follow up as needed

## 2014-07-20 DIAGNOSIS — IMO0001 Reserved for inherently not codable concepts without codable children: Secondary | ICD-10-CM | POA: Insufficient documentation

## 2014-07-20 DIAGNOSIS — Z4789 Encounter for other orthopedic aftercare: Secondary | ICD-10-CM | POA: Insufficient documentation

## 2014-07-20 HISTORY — DX: Reserved for inherently not codable concepts without codable children: IMO0001

## 2014-07-20 HISTORY — DX: Encounter for other orthopedic aftercare: Z47.89

## 2014-12-13 DIAGNOSIS — M180 Bilateral primary osteoarthritis of first carpometacarpal joints: Secondary | ICD-10-CM | POA: Insufficient documentation

## 2014-12-13 HISTORY — DX: Bilateral primary osteoarthritis of first carpometacarpal joints: M18.0

## 2015-01-23 DIAGNOSIS — M170 Bilateral primary osteoarthritis of knee: Secondary | ICD-10-CM

## 2015-01-23 HISTORY — DX: Bilateral primary osteoarthritis of knee: M17.0

## 2015-03-22 DIAGNOSIS — M25461 Effusion, right knee: Secondary | ICD-10-CM

## 2015-03-22 DIAGNOSIS — M19049 Primary osteoarthritis, unspecified hand: Secondary | ICD-10-CM | POA: Insufficient documentation

## 2015-03-22 HISTORY — DX: Effusion, right knee: M25.461

## 2015-03-22 HISTORY — DX: Primary osteoarthritis, unspecified hand: M19.049

## 2015-05-19 DIAGNOSIS — S83281A Other tear of lateral meniscus, current injury, right knee, initial encounter: Secondary | ICD-10-CM

## 2015-05-19 DIAGNOSIS — M5416 Radiculopathy, lumbar region: Secondary | ICD-10-CM | POA: Insufficient documentation

## 2015-05-19 DIAGNOSIS — S83242A Other tear of medial meniscus, current injury, left knee, initial encounter: Secondary | ICD-10-CM

## 2015-05-19 HISTORY — DX: Other tear of lateral meniscus, current injury, right knee, initial encounter: S83.281A

## 2015-05-19 HISTORY — DX: Other tear of medial meniscus, current injury, left knee, initial encounter: S83.242A

## 2015-05-19 HISTORY — DX: Radiculopathy, lumbar region: M54.16

## 2015-06-20 DIAGNOSIS — Z9181 History of falling: Secondary | ICD-10-CM

## 2015-06-20 HISTORY — DX: History of falling: Z91.81

## 2015-07-26 DIAGNOSIS — Z471 Aftercare following joint replacement surgery: Secondary | ICD-10-CM | POA: Insufficient documentation

## 2015-07-26 DIAGNOSIS — Z96651 Presence of right artificial knee joint: Secondary | ICD-10-CM

## 2015-07-26 HISTORY — DX: Presence of right artificial knee joint: Z47.1

## 2015-10-03 ENCOUNTER — Inpatient Hospital Stay (HOSPITAL_COMMUNITY)
Admission: AD | Admit: 2015-10-03 | Discharge: 2015-10-07 | DRG: 885 | Disposition: A | Discharge status: Home / Self Care | Payer: Medicaid Other | Attending: Psychiatry | Admitting: Psychiatry

## 2015-10-03 ENCOUNTER — Encounter (HOSPITAL_COMMUNITY): Payer: Self-pay | Admitting: *Deleted

## 2015-10-03 DIAGNOSIS — Z818 Family history of other mental and behavioral disorders: Secondary | ICD-10-CM | POA: Diagnosis not present

## 2015-10-03 DIAGNOSIS — G894 Chronic pain syndrome: Secondary | ICD-10-CM | POA: Diagnosis present

## 2015-10-03 DIAGNOSIS — R45851 Suicidal ideations: Secondary | ICD-10-CM | POA: Diagnosis present

## 2015-10-03 DIAGNOSIS — F333 Major depressive disorder, recurrent, severe with psychotic symptoms: Secondary | ICD-10-CM | POA: Diagnosis not present

## 2015-10-03 DIAGNOSIS — F332 Major depressive disorder, recurrent severe without psychotic features: Secondary | ICD-10-CM | POA: Diagnosis present

## 2015-10-03 HISTORY — DX: Major depressive disorder, recurrent, severe with psychotic symptoms: F33.3

## 2015-10-03 MED ORDER — DICLOFENAC SODIUM 75 MG PO TBEC
75.0000 mg | DELAYED_RELEASE_TABLET | Freq: Two times a day (BID) | ORAL | Status: DC
  Administered 2015-10-03 – 2015-10-07 (×8): 75 mg via ORAL
  Filled 2015-10-03 (×13): qty 1

## 2015-10-03 MED ORDER — NICOTINE 21 MG/24HR TD PT24
21.0000 mg | MEDICATED_PATCH | Freq: Every day | TRANSDERMAL | Status: DC
  Filled 2015-10-03 (×6): qty 1

## 2015-10-03 MED ORDER — ALBUTEROL SULFATE HFA 108 (90 BASE) MCG/ACT IN AERS
2.0000 | INHALATION_SPRAY | Freq: Four times a day (QID) | RESPIRATORY_TRACT | Status: DC | PRN
  Administered 2015-10-03 – 2015-10-07 (×5): 2 via RESPIRATORY_TRACT
  Filled 2015-10-03: qty 6.7

## 2015-10-03 MED ORDER — TRAZODONE HCL 100 MG PO TABS
100.0000 mg | ORAL_TABLET | Freq: Every day | ORAL | Status: DC
  Administered 2015-10-03 – 2015-10-06 (×4): 100 mg via ORAL
  Filled 2015-10-03 (×7): qty 1

## 2015-10-03 MED ORDER — ALUM & MAG HYDROXIDE-SIMETH 200-200-20 MG/5ML PO SUSP
30.0000 mL | ORAL | Status: DC | PRN
  Administered 2015-10-05: 30 mL via ORAL
  Filled 2015-10-03: qty 30

## 2015-10-03 MED ORDER — FLUTICASONE PROPIONATE 50 MCG/ACT NA SUSP
2.0000 | Freq: Every day | NASAL | Status: DC
  Administered 2015-10-03 – 2015-10-07 (×4): 2 via NASAL
  Filled 2015-10-03 (×2): qty 16

## 2015-10-03 MED ORDER — LORAZEPAM 1 MG PO TABS
1.0000 mg | ORAL_TABLET | Freq: Once | ORAL | Status: AC
  Administered 2015-10-03: 1 mg via ORAL
  Filled 2015-10-03: qty 1

## 2015-10-03 MED ORDER — ATORVASTATIN CALCIUM 40 MG PO TABS
40.0000 mg | ORAL_TABLET | Freq: Every day | ORAL | Status: DC
  Administered 2015-10-04 – 2015-10-06 (×3): 40 mg via ORAL
  Filled 2015-10-03 (×5): qty 1

## 2015-10-03 MED ORDER — FERROUS SULFATE 325 (65 FE) MG PO TABS
325.0000 mg | ORAL_TABLET | Freq: Every day | ORAL | Status: DC
  Administered 2015-10-04 – 2015-10-06 (×3): 325 mg via ORAL
  Filled 2015-10-03 (×6): qty 1

## 2015-10-03 MED ORDER — MAGNESIUM HYDROXIDE 400 MG/5ML PO SUSP
30.0000 mL | Freq: Every day | ORAL | Status: DC | PRN
  Administered 2015-10-04 – 2015-10-05 (×2): 30 mL via ORAL
  Filled 2015-10-03 (×2): qty 30

## 2015-10-03 MED ORDER — VENLAFAXINE HCL ER 150 MG PO CP24
150.0000 mg | ORAL_CAPSULE | Freq: Every day | ORAL | Status: DC
  Administered 2015-10-04: 150 mg via ORAL
  Filled 2015-10-03 (×2): qty 1

## 2015-10-03 MED ORDER — HYDROXYZINE HCL 25 MG PO TABS
25.0000 mg | ORAL_TABLET | Freq: Four times a day (QID) | ORAL | Status: DC | PRN
  Administered 2015-10-03 – 2015-10-05 (×3): 25 mg via ORAL
  Filled 2015-10-03 (×3): qty 1

## 2015-10-03 MED ORDER — ACETAMINOPHEN 325 MG PO TABS: 650.0000 mg | ORAL_TABLET | Freq: Four times a day (QID) | ORAL | Status: DC | PRN

## 2015-10-03 NOTE — BH Assessment (Signed)
Tele Assessment Note   Shirley Williams is a 60 y.o. female who presented voluntarily to [facility] on 09/30/2015. She was subsequently IVC'd. Below is the assessment completed by writer on that date:  Pt presents as very depressed AEB slow speech and excessive crying. Pt has had several deaths in her family within the last couple of months, including her dog. Pt is worried that her husband, who she indicates is very sick (he has RA), will leave her. Pt also reports seeing shadows and hearing people talking to her husband. Pt endorses SI with a passive plan to OD on her medications. Pt reports decreased sleep and appetite x 1 month. Pt sees Dr. Franz Dell for therapy and her PCP, Dr. Greig Right for medication management. Pt reports that her PCP refuses to give her a referral for psychiatry and prefers to treat her herself. Pt's husband was present during assessment and agreed with all pt reported.   Diagnosis: MDD, recurrent episode, severe, with psychotic features.   Past Medical History:  Past Medical History:  Diagnosis Date  . Arthritis   . Depression   . GERD (gastroesophageal reflux disease)     Past Surgical History:  Procedure Laterality Date  . APPENDECTOMY    . LAPAROSCOPIC APPENDECTOMY  01/15/2012   Procedure: APPENDECTOMY LAPAROSCOPIC;  Surgeon: Imogene Burn. Georgette Dover, MD;  Location: [facility];  Service: General;  Laterality: N/A;  . TONSILLECTOMY      Family History:  Family History  Problem Relation Age of Onset  . COPD Mother   . Cancer Father     brain    Social History:  reports that she has been smoking.  She has been smoking about 1.00 pack per day. She does not have any smokeless tobacco history on file. She reports that she drinks alcohol. She reports that she does not use drugs.  Additional Social History:  Alcohol / Drug Use Pain Medications: see med list on hard chart Prescriptions: see med list on hard chart Over the Counter: see med list on hard  chart History of alcohol / drug use?: No history of alcohol / drug abuse  CIWA:   COWS:    PATIENT STRENGTHS: (choose at least two) Capable of independent living Motivation for treatment/growth Supportive family/friends  Allergies:  Allergies  Allergen Reactions  . Sulfa Antibiotics     Home Medications:  (Not in a hospital admission)  OB/GYN Status:  No LMP recorded.  General Assessment Data Location of Assessment: [facility] TTS Assessment: Out of system Is this a Tele or Face-to-Face Assessment?: Tele Assessment Is this an Initial Assessment or a Re-assessment for this encounter?: Initial Assessment Marital status: Married Shirley Williams name: Shirley Williams Is patient pregnant?: No Pregnancy Status: No Living Arrangements: Spouse/significant other Can pt return to current living arrangement?: Yes Admission Status: Involuntary Is patient capable of signing voluntary admission?: Yes Referral Source: Self/Family/Friend Insurance type: Medicaid     Crisis Care Plan Living Arrangements: Spouse/significant other Name of Psychiatrist: none Name of Therapist: Dr. Franz Dell  Education Status Is patient currently in school?: No  Risk to self with the past 6 months Suicidal Ideation: Yes-Currently Present Has patient been a risk to self within the past 6 months prior to admission? : No Suicidal Intent: Yes-Currently Present Has patient had any suicidal intent within the past 6 months prior to admission? : No Is patient at risk for suicide?: Yes Suicidal Plan?: Yes-Currently Present Has patient had any suicidal plan within the past  6 months prior to admission? : No Specify Current Suicidal Plan: passive plan to OD on her meds Access to Means: Yes Specify Access to Suicidal Means: pt has many meds What has been your use of drugs/alcohol within the last 12 months?: pt denies Previous Attempts/Gestures: No Intentional Self Injurious Behavior: None Family Suicide  History: Unknown Recent stressful life event(s): Loss (Comment) (many deaths in family, including dog) Persecutory voices/beliefs?: No Depression: Yes Depression Symptoms: Tearfulness, Insomnia, Loss of interest in usual pleasures, Feeling worthless/self pity Substance abuse history and/or treatment for substance abuse?: No Suicide prevention information given to non-admitted patients: Not applicable  Risk to Others within the past 6 months Homicidal Ideation: No Does patient have any lifetime risk of violence toward others beyond the six months prior to admission? : No Thoughts of Harm to Others: No Current Homicidal Intent: No Current Homicidal Plan: No Access to Homicidal Means: No History of harm to others?: No Assessment of Violence: None Noted Does patient have access to weapons?: No Criminal Charges Pending?: No Does patient have a court date: No Is patient on probation?: No  Psychosis Hallucinations: Auditory, Visual Delusions: None noted  Mental Status Report Appearance/Hygiene: Unremarkable Eye Contact: Fair Motor Activity: Unremarkable Speech: Logical/coherent Level of Consciousness: Crying, Alert Mood: Depressed Affect: Appropriate to circumstance Anxiety Level: Minimal Thought Processes: Coherent, Relevant Judgement: Impaired Orientation: Person, Place, Time, Situation Obsessive Compulsive Thoughts/Behaviors: None  Cognitive Functioning Concentration: Normal Memory: Recent Intact, Remote Intact IQ: Average Insight: see judgement above Impulse Control: Unable to Assess Appetite: Poor Sleep: Decreased Vegetative Symptoms: None  ADLScreening [facility]) Patient's cognitive ability adequate to safely complete daily activities?: Yes Patient able to express need for assistance with ADLs?: Yes Independently performs ADLs?: Yes (appropriate for developmental age)  Prior Inpatient Therapy Prior Inpatient Therapy: No  Prior Outpatient  Therapy Prior Outpatient Therapy: No Does patient have an ACCT team?: No Does patient have Intensive In-House Services?  : No Does patient have [facility] services? : No Does patient have P4CC services?: No  ADL Screening (condition at time of admission) Patient's cognitive ability adequate to safely complete daily activities?: Yes Is the patient deaf or have difficulty hearing?: No Does the patient have difficulty seeing, even when wearing glasses/contacts?: No Does the patient have difficulty concentrating, remembering, or making decisions?: No Patient able to express need for assistance with ADLs?: Yes Does the patient have difficulty dressing or bathing?: No Independently performs ADLs?: Yes (appropriate for developmental age) Does the patient have difficulty walking or climbing stairs?: No Weakness of Legs: None Weakness of Arms/Hands: None  Home Assistive Devices/Equipment Home Assistive Devices/Equipment: None  Therapy Consults (therapy consults require a physician order) PT Evaluation Needed: No OT Evalulation Needed: No SLP Evaluation Needed: No Abuse/Neglect Assessment (Assessment to be complete while patient is alone) Physical Abuse: Denies Verbal Abuse: Denies Sexual Abuse: Denies Exploitation of patient/patient's resources: Denies Self-Neglect: Denies Values / Beliefs Cultural Requests During Hospitalization: None Spiritual Requests During Hospitalization: None Consults Spiritual Care Consult Needed: No Social Work Consult Needed: No Regulatory affairs officer (For Healthcare) Does patient have an advance directive?: No Would patient like information on creating an advanced directive?: No - patient declined information    Additional Information 1:1 In Past 12 Months?: No CIRT Risk: No Elopement Risk: No Does patient have medical clearance?: Yes     Disposition:  Disposition Initial Assessment Completed for this Encounter: Yes Disposition of Patient: Inpatient  treatment program Type of inpatient treatment program: Adult (accepted by Elmarie Shiley,  NP to [facility] 2 [phone])  Rexene Edison 10/03/2015 5:45 PM
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
## 2015-10-03 NOTE — Tx Team (Signed)
Initial Interdisciplinary Treatment Plan   PATIENT STRESSORS: Financial difficulties Health problems Medication change or noncompliance   PATIENT STRENGTHS: Ability for insight Communication skills Motivation for treatment/growth Religious Affiliation   PROBLEM LIST: Problem List/Patient Goals Date to be addressed Date deferred Reason deferred Estimated date of resolution  Depression 10/03/2015  10/03/2015   D/C  At risk for suicide 10/03/2015  10/03/2015   D/C  "Patience" 10/03/2015  10/03/2015   D/C  "Right medications" 10/03/2015  10/03/2015   D/C  "Shit" have a bowel movement 10/03/2015  10/03/2015   D/C                           DISCHARGE CRITERIA:  Ability to meet basic life and health needs Improved stabilization in mood, thinking, and/or behavior Motivation to continue treatment in a less acute level of care Need for constant or close observation no longer present Verbal commitment to aftercare and medication compliance  PRELIMINARY DISCHARGE PLAN: Outpatient therapy Return to previous living arrangement  PATIENT/FAMIILY INVOLVEMENT: This treatment plan has been presented to and reviewed with the patient, Shirley Williams.  The patient and family have been given the opportunity to ask questions and make suggestions.  Shirley Williams 10/03/2015, 9:42 PM

## 2015-10-04 ENCOUNTER — Encounter (HOSPITAL_COMMUNITY): Payer: Self-pay | Admitting: Psychiatry

## 2015-10-04 DIAGNOSIS — G894 Chronic pain syndrome: Secondary | ICD-10-CM | POA: Diagnosis present

## 2015-10-04 DIAGNOSIS — F333 Major depressive disorder, recurrent, severe with psychotic symptoms: Principal | ICD-10-CM

## 2015-10-04 HISTORY — DX: Chronic pain syndrome: G89.4

## 2015-10-04 MED ORDER — ARIPIPRAZOLE 5 MG PO TABS
5.0000 mg | ORAL_TABLET | Freq: Every day | ORAL | Status: DC
  Administered 2015-10-04 – 2015-10-06 (×3): 5 mg via ORAL
  Filled 2015-10-04 (×5): qty 1

## 2015-10-04 MED ORDER — TOPIRAMATE 25 MG PO TABS
50.0000 mg | ORAL_TABLET | Freq: Every day | ORAL | Status: DC
  Administered 2015-10-05 – 2015-10-07 (×3): 50 mg via ORAL
  Filled 2015-10-04 (×5): qty 2

## 2015-10-04 MED ORDER — CLONAZEPAM 1 MG PO TABS
1.0000 mg | ORAL_TABLET | Freq: Every day | ORAL | Status: DC | PRN
Start: 2015-10-04 — End: 2015-10-07
  Administered 2015-10-04 – 2015-10-07 (×4): 1 mg via ORAL
  Filled 2015-10-04 (×5): qty 1

## 2015-10-04 MED ORDER — PAROXETINE HCL 30 MG PO TABS: 30.0000 mg | ORAL_TABLET | Freq: Every day | ORAL | Status: DC

## 2015-10-04 MED ORDER — VENLAFAXINE HCL ER 37.5 MG PO CP24
37.5000 mg | ORAL_CAPSULE | Freq: Every day | ORAL | Status: DC
  Administered 2015-10-05: 37.5 mg via ORAL
  Filled 2015-10-04 (×2): qty 1

## 2015-10-04 MED ORDER — TRAMADOL HCL 50 MG PO TABS
50.0000 mg | ORAL_TABLET | Freq: Two times a day (BID) | ORAL | Status: DC | PRN
  Administered 2015-10-04 – 2015-10-07 (×5): 50 mg via ORAL
  Filled 2015-10-04 (×5): qty 1

## 2015-10-04 MED ORDER — VENLAFAXINE HCL ER 37.5 MG PO CP24
37.5000 mg | ORAL_CAPSULE | Freq: Every day | ORAL | Status: DC
Start: 2015-10-05 — End: 2015-10-04
  Filled 2015-10-04: qty 1

## 2015-10-04 MED ORDER — PANTOPRAZOLE SODIUM 40 MG PO TBEC
80.0000 mg | DELAYED_RELEASE_TABLET | Freq: Every day | ORAL | Status: DC
  Administered 2015-10-04 – 2015-10-07 (×4): 80 mg via ORAL
  Filled 2015-10-04 (×7): qty 2

## 2015-10-04 MED ORDER — PAROXETINE HCL 30 MG PO TABS
30.0000 mg | ORAL_TABLET | Freq: Every morning | ORAL | Status: DC
  Filled 2015-10-04: qty 1

## 2015-10-04 MED ORDER — FLUOXETINE HCL 20 MG PO CAPS
20.0000 mg | ORAL_CAPSULE | Freq: Every day | ORAL | Status: DC
  Administered 2015-10-04 – 2015-10-05 (×2): 20 mg via ORAL
  Filled 2015-10-04 (×3): qty 1

## 2015-10-04 MED ORDER — TOPIRAMATE 100 MG PO TABS
100.0000 mg | ORAL_TABLET | Freq: Every day | ORAL | Status: DC
  Administered 2015-10-04 – 2015-10-06 (×3): 100 mg via ORAL
  Filled 2015-10-04 (×5): qty 1

## 2015-10-04 NOTE — [facility] Group Notes (Signed)
[facility] L[facility] Group Therapy  10/04/2015 , 3:19 PM   Type of Therapy:  Group Therapy  Participation Level:  Active  Participation Quality:  Attentive  Affect:  Appropriate  Cognitive:  Alert  Insight:  Improving  Engagement in Therapy:  Engaged  Modes of Intervention:  Discussion, Exploration and Socialization  Summary of Progress/Problems: Today's group focused on the term Diagnosis.  Participants were asked to define the term, and then pronounce whether it is a negative, positive or neutral term.  Stayed the entire time, engaged throughout.  Her contributions all centered around the theme of death and loss, and she gave several examples of recent losses, many of which were dogs. She also talked about picking up the phone to call her mother, "who has been deceased for over 15 years now."  Tearful several times.  [name] 10/04/2015 , 3:19 PM

## 2015-10-04 NOTE — [facility] Group Notes (Signed)
[facility] Group Notes:  (Nursing/MHT/Case Management/Adjunct)  Date:  10/04/2015  Time:  4:39 PM  Type of Therapy:  Nurse Education  Participation Level:  Active  Participation Quality:  Appropriate and Attentive  Affect:  Appropriate  Cognitive:  Alert and Appropriate  Insight:  Appropriate and Good  Engagement in Group:  Engaged  Modes of Intervention:  Discussion and Education  Summary of Progress/Problems: Topic was on Recovery.  Discussed what recovery means to the patient.  Emphasize recovery as a process and to celebrate any milestone or accomplishment during the process.  Patient was attentive and receptive.  Mart Piggs 10/04/2015, 4:39 PM

## 2015-10-04 NOTE — Progress Notes (Signed)
DAR NOTE: Patient presents with anxious affect and depressed mood.  Denies auditory and visual hallucinations.  Described energy level as normal and concentration as poor.  Rates depression at 4, hopelessness at 0, and anxiety at 2.  Maintained on routine safety checks.  Medications given as prescribed.  Support and encouragement offered as needed.  Attended group and participated.  States goal for today is "going home and feeling better."  Patient observed socializing with peers in the dayroom.  Received MOM 30 ml for complain of constipation with result.  Patient was preoccupied with her bowel regimen and wanted to resume her home medication.  MD made aware.  Patient is compliant with treatment and medication management.
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
## 2015-10-04 NOTE — Progress Notes (Signed)
Adult Psychoeducational Group Note  Date:  10/04/2015 Time:  8:54 PM  Group Topic/Focus:  Wrap-Up Group:   The focus of this group is to help patients review their daily goal of treatment and discuss progress on daily workbooks.   Participation Level:  Active  Participation Quality:  Appropriate  Affect:  Appropriate  Cognitive:  Appropriate  Insight: Appropriate  Engagement in Group:  Engaged  Modes of Intervention:  Discussion  Additional Comments: The patient expressed that she attend groups.The patient also said that group was about communities. Nash Shearer 10/04/2015, 8:54 PM

## 2015-10-05 LAB — LIPID PANEL
Cholesterol: 223 mg/dL — ABNORMAL HIGH (ref 0–200)
HDL: 65 mg/dL (ref >40)
LDL CALC: 123 mg/dL — AB (ref 0–99)
Total CHOL/HDL Ratio: 3.4 RATIO
Triglycerides: 175 mg/dL — ABNORMAL HIGH (ref <150)
VLDL: 35 mg/dL (ref 0–40)

## 2015-10-05 LAB — TSH: TSH: 1.978 u[IU]/mL (ref 0.350–4.500)

## 2015-10-05 MED ORDER — FLUOXETINE HCL 20 MG PO CAPS
40.0000 mg | ORAL_CAPSULE | Freq: Every day | ORAL | Status: DC
  Administered 2015-10-06 – 2015-10-07 (×2): 40 mg via ORAL
  Filled 2015-10-05 (×4): qty 2

## 2015-10-05 NOTE — BHH Group Notes (Signed)
[facility] Group Therapy  10/05/2015 , 1:02 PM    Type of Therapy:  Mental Health Association Presentation  Participation Level:  Active  Participation Quality:  Attentive  Affect:  Blunted  Cognitive:  Oriented  Insight:  Limited  Engagement in Therapy:  Engaged  Modes of Intervention:  Discussion, Education and Socialization  Summary of Progress/Problems:  Shirley Williams from [place] came to present his recovery story and play the guitar.  Stayed the entire time, engaged throughout.  [name] 10/05/2015 , 1:02 PM

## 2015-10-05 NOTE — Progress Notes (Signed)
   D: When asked about her day pt stated, "when can I go home, please?"  Stated that she's ready to discharge to, "take care of business". When asked what the business was pt stated, "take care of my husband". Pt also informed the writer that her ultram med is incorrect; should be 1 to 2 tabs q 4 to 6 hrs prn. Stated protonix doesn't work and would rather have nexium. Writer informed pt that she's scheduled a double dose of 80mg . Writer complained of constipation.  Stated it's been 6 days since she's had a BM.  Pt has no questions or concerns.   A:  Support and encouragement was offered. 15 min checks continued for safety.  R: Pt remains safe.
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
## 2015-10-05 NOTE — Tx Team (Signed)
Interdisciplinary Treatment Plan Update (Adult)  Date:  10/05/2015   Time Reviewed:  11:29 AM   Progress in Treatment: Attending groups: Yes. Participating in groups:  Yes. Taking medication as prescribed:  Yes. Tolerating medication:  Yes. Family/Significant other contact made:  Yes Patient understands diagnosis:  Yes  As evidenced by seeking help with depression Discussing patient identified problems/goals with staff:  Yes, see initial care plan. Medical problems stabilized or resolved:  Yes. Denies suicidal/homicidal ideation: Yes. Issues/concerns per patient self-inventory:  No. Other:  New problem(s) identified:  Discharge Plan or Barriers: see below  Reason for Continuation of Hospitalization: Depression Hallucinations Medication stabilization  Comments:   Pt presents as very depressed AEB slow speech and excessive crying. Pt has had several deaths in her family within the last couple of months, including her dog. Pt is worried that her husband, who she indicates is very sick (he has RA), will leave her. Pt also reports seeing shadows and hearing people talking to her husband. Pt endorses SI with a passive plan to OD on her medications. Pt reports decreased sleep and appetite x 1 month. Will continue to taper off Effexor xr and increase Prozac to 40 mg po daily for depressive sx/anxiety sx. Will continue Abilify 5 mg po qhs for augmenting the effect of Prozac. Will continue Trazodone 100 mg po qhs for sleep. Will make available Klonopin 1 mg po daily prn for panic sx. Will continue Vistaril 25 mg po prn for anxiety sx.  Estimated length of stay: 2-4 days  New goal(s):  Review of initial/current patient goals per problem list:   Review of initial/current patient goals per problem list:  1. Goal(s): Patient will participate in aftercare plan   Met: Yes   Target date: 3-5 days post admission date   As evidenced by: Patient will participate within aftercare plan  AEB aftercare provider and housing plan at discharge being identified. 10/05/15:  Return home, follow up outpt   2. Goal (s): Patient will exhibit decreased depressive symptoms and suicidal ideations.   Met: No   Target date: 3-5 days post admission date   As evidenced by: Patient will utilize self rating of depression at 3 or below and demonstrate decreased signs of depression or be deemed stable for discharge by MD. 10/05/15:  Rates depression a 4 today      5. Goal(s): Patient will demonstrate decreased signs of psychosis  * Met: Yes  * Target date: 3-5 days post admission date  * As evidenced by: Patient will demonstrate decreased frequency of AVH or return to baseline function 10/05/15:  Denies psychosis today.  States she has had no incidence since prior to admission          Attendees: Patient:  10/05/2015 11:29 AM   Family:   10/05/2015 11:29 AM   Physician:  Ursula Alert, MD 10/05/2015 11:29 AM   Nursing:   Leanne Lovely, RN 10/05/2015 11:29 AM   CSW:    Roque Lias, LCSW   10/05/2015 11:29 AM   Other:  10/05/2015 11:29 AM   Other:   10/05/2015 11:29 AM   Other:  Lars Pinks, Nurse CM 10/05/2015 11:29 AM   Other:   10/05/2015 11:29 AM   Other:  Norberto Sorenson, [place]  10/05/2015 11:29 AM   Other:  10/05/2015 11:29 AM   Other:  10/05/2015 11:29 AM   Other:  10/05/2015 11:29 AM   Other:  10/05/2015 11:29 AM   Other:  10/05/2015 11:29 AM   Other:  10/05/2015 11:29 AM    Scribe for Treatment Team:   Trish Mage, 10/05/2015 11:29 AM

## 2015-10-05 NOTE — Progress Notes (Signed)
D: Pt was in the hallway upon initial approach.  Pt presents with depressed affect and mood.  She describes her day as "interesting."  Pt reports her goal is to "move forward" and "every step you take forward, that's a good day."  Pt reports she is discharging tomorrow and she feels safe to do so.  Pt denies SI/HI.  She initially denies hallucinations.  Pt reports "I still talk to my mother and I'm going to continue to do so.  My mother and I were very close."  Pt reports back pain of 7/10.  She attended evening group and has interacted appropriately with peers tonight.    A: Introduced self to pt.  Actively listened to pt and offered support and encouragement.  Medication administered per order.  PRN medication offered for pain, pt refused.    R: Pt is compliant with scheduled medications.  Pt verbally contracts for safety.  Will continue to monitor and assess.

## 2015-10-05 NOTE — Progress Notes (Signed)
Data: Patient C/O pain and anxiety this shift. She received her vistaril at 8:24AM but then became very scared/worried about another peer and his aggressive behavior and then requested her daily Klonopin. She then went to her room to, "lay down", and did not get up for lunch. Patient keeping mostly to herself on the unit, in her room. Denies SI/HI/AVH. Patient reported 7/10 for depression, 2/10 for hopelessness and 5/10 for anxiety on her self inventory. Action: Emotional support and encouragement offered. Education provided on medication, indications and side effect. Q 15 minute checks done for safety. Response. Safety on the unit maintained through 15 minute checks.  Medications taken as prescribed. Did not attended groups. Anxiety medication helped to keep her appropriate through out shift.

## 2015-10-05 NOTE — BHH Suicide Risk Assessment (Signed)
[place] INPATIENT:  Family/Significant Other Suicide Prevention Education  Suicide Prevention Education:  Education Completed; Averlee Redfern, husband, [phone] has been identified by the patient as the family member/significant other with whom the patient will be residing, and identified as the person(s) who will aid the patient in the event of a mental health crisis (suicidal ideations/suicide attempt).  With written consent from the patient, the family member/significant other has been provided the following suicide prevention education, prior to the and/or following the discharge of the patient.  The suicide prevention education provided includes the following:  Suicide risk factors  Suicide prevention and interventions  National Suicide Hotline telephone number  [facility] assessment telephone number  [facility] Emergency Assistance [place] and/or Residential Mobile Crisis Unit telephone number  Request made of family/significant other to:  Remove weapons (e.g., guns, rifles, knives), all items previously/currently identified as safety concern.    Remove drugs/medications (over-the-counter, prescriptions, illicit drugs), all items previously/currently identified as a safety concern.  The family member/significant other verbalizes understanding of the suicide prevention education information provided.  The family member/significant other agrees to remove the items of safety concern listed above.  [name] 10/05/2015, 3:39 PM

## 2015-10-05 NOTE — Progress Notes (Signed)
[facility] MD Progress Note  10/05/2015 1:11 PM Shirley Williams  MRN:  [id number] Subjective: Patient states " I am better. I am still not doing well physically , I am in a different environment and that is very uncomfortable for me. I felt bad prior to coming here because it was the anniversary of my friend who shot himself in my backyard.'  Objective:Shirley Williams a 60 y.o.caucasian femalewho is married , lives in [place] with her husband , has a hx of depression, presented voluntarily to [facility] on 09/30/2015. She was subsequently IVC'd.  Patient seen and chart reviewed.Discussed patient with treatment team.  Patient today seen as less depressed than yesterday , continues to be anxious , has several somatic complaints . Pt continues to ruminate about her friend who committed suicide and her dog that died 2 weeks ago. Pt currently tolerating Prozac which she was on in the past. Continue to taper of effexor. Per staff - pt continues to be very demanding - continue to need support.   Principal Problem: MDD (major depressive disorder), recurrent, severe, with psychosis ([place]) Diagnosis:   Patient Active Problem List   Diagnosis Date Noted  . Chronic pain syndrome [G89.4] 10/04/2015  . MDD (major depressive disorder), recurrent, severe, with psychosis ([place]) [F33.3] 10/03/2015  . Acute appendicitis [K35.80] 01/15/2012   Total Time spent with patient: 25 minutes  Past Psychiatric History:Please see H&P.   Past Medical History:  Past Medical History:  Diagnosis Date  . Arthritis   . Depression   . GERD (gastroesophageal reflux disease)     Past Surgical History:  Procedure Laterality Date  . APPENDECTOMY    . LAPAROSCOPIC APPENDECTOMY  01/15/2012   Procedure: APPENDECTOMY LAPAROSCOPIC;  Surgeon: Imogene Burn. Georgette Dover, MD;  Location: [facility] OR;  Service: General;  Laterality: N/A;  . TONSILLECTOMY     Family History:  Family History  Problem Relation Age of Onset  .  COPD Mother   . Cancer Father     brain  . Asthma Sister   . Depression Sister    Family Psychiatric  History: Please see H&P.  Social History:  History  Alcohol Use  . Yes    Comment: occ     History  Drug Use No    Social History   Social History  . Marital status: Married    Spouse name: N/A  . Number of children: N/A  . Years of education: N/A   Social History Main Topics  . Smoking status: Former Smoker    Packs/day: 1.00  . Smokeless tobacco: Never Used  . Alcohol use Yes     Comment: occ  . Drug use: No  . Sexual activity: Yes    Birth control/ protection: Post-menopausal   Other Topics Concern  . None   Social History Narrative  . None   Additional Social History:    Pain Medications: see med list on hard chart Prescriptions: see med list on hard chart Over the Counter: see med list on hard chart History of alcohol / drug use?: No history of alcohol / drug abuse                    Sleep: Fair  Appetite:  Fair  Current Medications: Current Facility-Administered Medications  Medication Dose Route Frequency Provider Last Rate Last Dose  . acetaminophen (TYLENOL) tablet 650 mg  650 mg Oral Q6H PRN Niel Hummer, NP      . albuterol (PROVENTIL  HFA;VENTOLIN HFA) 108 (90 Base) MCG/ACT inhaler 2 puff  2 puff Inhalation Q6H PRN Laverle Hobby, PA-C   2 puff at 10/05/15 [phone]  . alum & mag hydroxide-simeth (MAALOX/MYLANTA) 200-200-20 MG/5ML suspension 30 mL  30 mL Oral Q4H PRN Niel Hummer, NP   30 mL at 10/05/15 0817  . ARIPiprazole (ABILIFY) tablet 5 mg  5 mg Oral QHS Ursula Alert, MD   5 mg at 10/04/15 2124  . atorvastatin (LIPITOR) tablet 40 mg  40 mg Oral q1800 Niel Hummer, NP   40 mg at 10/04/15 1715  . clonazePAM (KLONOPIN) tablet 1 mg  1 mg Oral Daily PRN Ursula Alert, MD   1 mg at 10/05/15 1143  . diclofenac (VOLTAREN) EC tablet 75 mg  75 mg Oral BID Laverle Hobby, PA-C   75 mg at 10/05/15 0820  . ferrous sulfate tablet 325 mg   325 mg Oral Q breakfast Niel Hummer, NP   325 mg at 10/05/15 0820  . [START ON 10/06/2015] FLUoxetine (PROZAC) capsule 40 mg  40 mg Oral Daily Brayley Mackowiak, MD      . fluticasone (FLONASE) 50 MCG/ACT nasal spray 2 spray  2 spray Each Nare Daily Laverle Hobby, PA-C   2 spray at 10/05/15 0813  . hydrOXYzine (ATARAX/VISTARIL) tablet 25 mg  25 mg Oral Q6H PRN Niel Hummer, NP   25 mg at 10/05/15 0824  . magnesium hydroxide (MILK OF MAGNESIA) suspension 30 mL  30 mL Oral Daily PRN Niel Hummer, NP   30 mL at 10/04/15 0815  . nicotine (NICODERM CQ - dosed in mg/24 hours) patch 21 mg  21 mg Transdermal Daily Niel Hummer, NP      . pantoprazole (PROTONIX) EC tablet 80 mg  80 mg Oral Daily Ursula Alert, MD   80 mg at 10/05/15 0816  . topiramate (TOPAMAX) tablet 100 mg  100 mg Oral QHS Ursula Alert, MD   100 mg at 10/04/15 2124  . topiramate (TOPAMAX) tablet 50 mg  50 mg Oral Daily Ursula Alert, MD   50 mg at 10/05/15 0817  . traMADol (ULTRAM) tablet 50 mg  50 mg Oral Q12H PRN Ursula Alert, MD   50 mg at 10/05/15 0922  . traZODone (DESYREL) tablet 100 mg  100 mg Oral QHS Niel Hummer, NP   100 mg at 10/04/15 2124    Lab Results:  Results for orders placed or performed during the hospital encounter of 10/03/15 (from the past 48 hour(s))  TSH     Status: None   Collection Time: 10/05/15  6:27 AM  Result Value Ref Range   TSH 1.978 0.350 - 4.500 uIU/mL    Comment: Performed at [facility]  Lipid panel     Status: Abnormal   Collection Time: 10/05/15  6:27 AM  Result Value Ref Range   Cholesterol 223 (H) 0 - 200 mg/dL   Triglycerides 175 (H) <150 mg/dL   HDL 65 >40 mg/dL   Total CHOL/HDL Ratio 3.4 RATIO   VLDL 35 0 - 40 mg/dL   LDL Cholesterol 123 (H) 0 - 99 mg/dL    Comment:        Total Cholesterol/HDL:CHD Risk Coronary Heart Disease Risk Table                     Men   Women  1/2 Average Risk   3.4   3.3  Average Risk  5.0   4.4  2 X Average Risk   9.6    7.1  3 X Average Risk  23.4   11.0        Use the calculated Patient Ratio above and the CHD Risk Table to determine the patient's CHD Risk.        ATP III CLASSIFICATION (LDL):  <100     mg/dL   Optimal  100-129  mg/dL   Near or Above                    Optimal  130-159  mg/dL   Borderline  160-189  mg/dL   High  >190     mg/dL   Very High Performed at [facility]     Blood Alcohol level:  No results found for: [facility]  Metabolic Disorder Labs: No results found for: HGBA1C, MPG No results found for: PROLACTIN Lab Results  Component Value Date   CHOL 223 (H) 10/05/2015   TRIG 175 (H) 10/05/2015   HDL 65 10/05/2015   CHOLHDL 3.4 10/05/2015   VLDL 35 10/05/2015   LDLCALC 123 (H) 10/05/2015    Physical Findings: AIMS: Facial and Oral Movements Muscles of Facial Expression: None, normal Lips and Perioral Area: None, normal Jaw: None, normal Tongue: None, normal,Extremity Movements Upper (arms, wrists, hands, fingers): None, normal Lower (legs, knees, ankles, toes): None, normal, Trunk Movements Neck, shoulders, hips: None, normal, Overall Severity Severity of abnormal movements (highest score from questions above): None, normal Incapacitation due to abnormal movements: None, normal Patient's awareness of abnormal movements (rate only patient's report): No Awareness, Dental Status Current problems with teeth and/or dentures?: No Does patient usually wear dentures?: No  CIWA:    COWS:     Musculoskeletal: Strength & Muscle Tone: within normal limits Gait & Station: normal Patient leans: N/A  Psychiatric Specialty Exam: Physical Exam  Nursing note and vitals reviewed. Constitutional:  I concur with PE done in ED    Review of Systems  Musculoskeletal: Positive for back pain, joint pain and neck pain.  Psychiatric/Behavioral: Positive for depression. The patient is nervous/anxious.   All other systems reviewed and are negative.   Blood pressure 130/89,  pulse 100, temperature 98.2 F (36.8 C), temperature source Oral, resp. rate 16, height 5' 1.5" (1.562 m), weight 75.8 kg (167 lb).Body mass index is 31.04 kg/m.  General Appearance: Fairly Groomed  Eye Contact:  Fair  Speech:  Normal Rate  Volume:  Decreased  Mood:  Anxious, Depressed and Dysphoric  Affect:  Depressed  Thought Process:  Goal Directed and Descriptions of Associations: Circumstantial  Orientation:  Full (Time, Place, and Person)  Thought Content:  Rumination  Suicidal Thoughts:  contracts for safety on the unit  Homicidal Thoughts:  No  Memory:  Immediate;   Fair Recent;   Fair Remote;   Fair  Judgement:  Impaired  Insight:  Shallow  Psychomotor Activity:  Restlessness  Concentration:  Concentration: Fair and Attention Span: Fair  Recall:  [employer] of Knowledge:  Fair  Language:  Fair  Akathisia:  No  Handed:  Right  AIMS (if indicated):     Assets:  Communication Skills Social Support  ADL's:  Intact  Cognition:  WNL  Sleep:        Treatment Plan Summary: Shirley Williams a 60 y.o.caucasian femalewho is married , lives in [place] with her husband , has a hx of depression, presented voluntarily to [facility] on  09/30/2015. She was subsequently IVC'd.  Patient decompensated on the anniversary of her friends's death who committed suicide and also her dog had died days ago. Pt continues to need treatment.   Daily contact with patient to assess and evaluate symptoms and progress in treatment and Medication management Will continue to taper off Effexor xr and increase Prozac to 40 mg po daily for depressive sx/anxiety sx. Will continue Abilify 5 mg po qhs for augmenting the effect of Prozac. Will continue Trazodone 100 mg po qhs for sleep. Will make available Klonopin 1 mg po daily prn for panic sx. Will continue Vistaril 25 mg po prn for anxiety sx. Restarted home medications where indicated. Will continue to monitor vitals  ,medication compliance and treatment side effects while patient is here.  Will monitor for medical issues as well as call consult as needed. Will get PT consult today. Reviewed labs uds- pos for thc, bzd, cbc - wnl, cmp -wnl, tsh- wnl , lipid panel- abnormal - recommend diet control , pending hba1c. EKG - qtc - wnl . CSW will continue working on disposition.  Patient to participate in therapeutic milieu .     Lyn Deemer, MD 10/05/2015, 1:11 PM

## 2015-10-06 LAB — HEMOGLOBIN A1C
HEMOGLOBIN A1C: 5.3 % (ref 4.8–5.6)
MEAN PLASMA GLUCOSE: 105 mg/dL

## 2015-10-06 MED ORDER — DICYCLOMINE HCL 10 MG PO CAPS
10.0000 mg | ORAL_CAPSULE | Freq: Once | ORAL | Status: AC
Start: 2015-10-06 — End: 2015-10-06
  Administered 2015-10-06: 10 mg via ORAL
  Filled 2015-10-06: qty 1

## 2015-10-06 MED ORDER — DICYCLOMINE HCL 10 MG PO CAPS
10.0000 mg | ORAL_CAPSULE | Freq: Three times a day (TID) | ORAL | Status: DC
  Administered 2015-10-06 – 2015-10-07 (×4): 10 mg via ORAL
  Filled 2015-10-06 (×12): qty 1

## 2015-10-06 NOTE — Plan of Care (Signed)
Problem: Medication: Goal: Compliance with prescribed medication regimen will improve Outcome: Progressing Pt has been compliant with medications tonight.    

## 2015-10-06 NOTE — BHH Group Notes (Signed)
[place] Group Notes:  (Counselor/Nursing/MHT/Case Management/Adjunct)  10/06/2015 1:15PM  Type of Therapy:  Group Therapy  Participation Level:  Active  Participation Quality:  Appropriate  Affect:  Flat  Cognitive:  Oriented  Insight:  Improving  Engagement in Group:  Limited  Engagement in Therapy:  Limited  Modes of Intervention:  Discussion, Exploration and Socialization  Summary of Progress/Problems: The topic for group was balance in life.  Pt participated in the discussion about when their life was in balance and out of balance and how this feels.  Pt discussed ways to get back in balance and short term goals they can work on to get where they want to be. Stayed the entire time, engaged throughout.  States she feels unbalanced today because of medical issues-her bp is high and she has a headache, and her arthritis is bothering her and she is in a lot of pain today.  When unbalanced, she reads fiction to find balance-named several authors.   Trish Mage 10/06/2015 4:54 PM

## 2015-10-06 NOTE — Progress Notes (Signed)
PT Note  Patient Details Name: Shirley Williams MRN: [id number] DOB: [birth year]      Noted we have an order for a consult for PT to evaluate and treat. Attempted to call nurse prior to coming to be best prepared for consult and have best timing. Will call back around 10:15 when nurse available.   Thanks   Clide Dales 10/06/2015, 9:38 AM  Clide Dales, PT Pager: [phone] 10/06/2015

## 2015-10-06 NOTE — Progress Notes (Signed)
  [facility] Adult Case Management Discharge Plan :  Will you be returning to the same living situation after discharge:  Yes,  home At discharge, do you have transportation home?: Yes,  husband Do you have the ability to pay for your medications: Yes,  mental health  Release of information consent forms completed and in the chart;  Patient's signature needed at discharge.  Patient to Follow up at: Follow-up Information    [facility] .   Contact information: [place] Follow up on 10/13/2015.   Why:  Thursday at 3:00 Contact information: [place]  [phone]          Next level of care provider has access to [place] and Suicide Prevention discussed: Yes,  yes  Have you used any form of tobacco in the last 30 days? (Cigarettes, Smokeless Tobacco, Cigars, and/or Pipes): No  Has patient been referred to the Quitline?: N/A patient is not a smoker  Patient has been referred for addiction treatment: N/A  Shirley Williams 10/06/2015, 11:45 AM

## 2015-10-06 NOTE — Progress Notes (Signed)
Data. Patient denies SI/HI/AVH.   Patient interacting well with staff and other patients. Did have one episode of tearfulness after speaking with her husband on the 'phone. Patient reports, "He spent o$400.00 on bills and I was wanting to use that money to go to the beach for our anniversary". On her self inventory patient reports, 0/10 for all areas and her goal is: "Taking care of me. Moving forward." Action. Emotional support and encouragement offered. Education provided on medication, indications and side effect. Q 15 minute checks done for safety. Response. Safety on the unit maintained through 15 minute checks.  Medications taken as prescribed. Attended groups. Remained calm and appropriate through out shift.

## 2015-10-06 NOTE — Progress Notes (Signed)
[facility] MD Progress Note  10/06/2015 3:22 PM Shirley Williams  MRN:  [id number] Subjective: Patient states " I am still dealing with myself , I have IBS and I am in pain also. I passed my bowels today , I feel good about that.'   Objective:Shirley Williams a 60 y.o.caucasian femalewho is married , lives in [place] with her husband , has a hx of depression, presented voluntarily to [facility] on 09/30/2015. She was subsequently IVC'd.  Patient seen and chart reviewed.Discussed patient with treatment team.  Patient today seen as anxious ,her depression seems to be improving. Pt continues to ruminate about her several stressors - continues to need encouragement and support. Pt wants to be back on doxycycline - reports she is on 10 mg - called pharmacist who will call her pharmacy to verify and would restart her on her home dose. Pt currently tolerating Prozac which she was on in the past. Continue to taper of effexor. Per staff - pt continues to be very demanding - continue to need support.   Principal Problem: MDD (major depressive disorder), recurrent, severe, with psychosis ([place]) Diagnosis:   Patient Active Problem List   Diagnosis Date Noted  . Chronic pain syndrome [G89.4] 10/04/2015  . MDD (major depressive disorder), recurrent, severe, with psychosis ([place]) [F33.3] 10/03/2015  . Acute appendicitis [K35.80] 01/15/2012   Total Time spent with patient: 20 minutes  Past Psychiatric History:Please see H&P.   Past Medical History:  Past Medical History:  Diagnosis Date  . Arthritis   . Depression   . GERD (gastroesophageal reflux disease)     Past Surgical History:  Procedure Laterality Date  . APPENDECTOMY    . LAPAROSCOPIC APPENDECTOMY  01/15/2012   Procedure: APPENDECTOMY LAPAROSCOPIC;  Surgeon: Imogene Burn. Georgette Dover, MD;  Location: [place] OR;  Service: General;  Laterality: N/A;  . TONSILLECTOMY     Family History:  Family History  Problem Relation Age of Onset   . COPD Mother   . Cancer Father     brain  . Asthma Sister   . Depression Sister    Family Psychiatric  History: Please see H&P.  Social History:  History  Alcohol Use  . Yes    Comment: occ     History  Drug Use No    Social History   Social History  . Marital status: Married    Spouse name: N/A  . Number of children: N/A  . Years of education: N/A   Social History Main Topics  . Smoking status: Former Smoker    Packs/day: 1.00  . Smokeless tobacco: Never Used  . Alcohol use Yes     Comment: occ  . Drug use: No  . Sexual activity: Yes    Birth control/ protection: Post-menopausal   Other Topics Concern  . None   Social History Narrative  . None   Additional Social History:    Pain Medications: see med list on hard chart Prescriptions: see med list on hard chart Over the Counter: see med list on hard chart History of alcohol / drug use?: No history of alcohol / drug abuse                    Sleep: Fair  Appetite:  Fair  Current Medications: Current Facility-Administered Medications  Medication Dose Route Frequency Provider Last Rate Last Dose  . acetaminophen (TYLENOL) tablet 650 mg  650 mg Oral Q6H PRN Niel Hummer, NP      .  albuterol (PROVENTIL HFA;VENTOLIN HFA) 108 (90 Base) MCG/ACT inhaler 2 puff  2 puff Inhalation Q6H PRN Laverle Hobby, PA-C   2 puff at 10/06/15 [phone]  . alum & mag hydroxide-simeth (MAALOX/MYLANTA) 200-200-20 MG/5ML suspension 30 mL  30 mL Oral Q4H PRN Niel Hummer, NP   30 mL at 10/05/15 0817  . ARIPiprazole (ABILIFY) tablet 5 mg  5 mg Oral QHS Ursula Alert, MD   5 mg at 10/05/15 2116  . atorvastatin (LIPITOR) tablet 40 mg  40 mg Oral q1800 Niel Hummer, NP   40 mg at 10/05/15 1723  . clonazePAM (KLONOPIN) tablet 1 mg  1 mg Oral Daily PRN Ursula Alert, MD   1 mg at 10/05/15 1143  . diclofenac (VOLTAREN) EC tablet 75 mg  75 mg Oral BID Laverle Hobby, PA-C   75 mg at 10/06/15 [id number]  . dicyclomine (BENTYL) capsule  10 mg  10 mg Oral TID AC & HS Dalen [place], MD      . ferrous sulfate tablet 325 mg  325 mg Oral Q breakfast Niel Hummer, NP   325 mg at 10/06/15 0753  . FLUoxetine (PROZAC) capsule 40 mg  40 mg Oral Daily Ursula Alert, MD   40 mg at 10/06/15 0753  . fluticasone (FLONASE) 50 MCG/ACT nasal spray 2 spray  2 spray Each Nare Daily Laverle Hobby, PA-C   2 spray at 10/06/15 [id number]  . hydrOXYzine (ATARAX/VISTARIL) tablet 25 mg  25 mg Oral Q6H PRN Niel Hummer, NP   25 mg at 10/05/15 0824  . magnesium hydroxide (MILK OF MAGNESIA) suspension 30 mL  30 mL Oral Daily PRN Niel Hummer, NP   30 mL at 10/05/15 1536  . nicotine (NICODERM CQ - dosed in mg/24 hours) patch 21 mg  21 mg Transdermal Daily Niel Hummer, NP      . pantoprazole (PROTONIX) EC tablet 80 mg  80 mg Oral Daily Ursula Alert, MD   80 mg at 10/06/15 0752  . topiramate (TOPAMAX) tablet 100 mg  100 mg Oral QHS Ursula Alert, MD   100 mg at 10/05/15 2117  . topiramate (TOPAMAX) tablet 50 mg  50 mg Oral Daily Ursula Alert, MD   50 mg at 10/06/15 0753  . traMADol (ULTRAM) tablet 50 mg  50 mg Oral Q12H PRN Ursula Alert, MD   50 mg at 10/06/15 1128  . traZODone (DESYREL) tablet 100 mg  100 mg Oral QHS Niel Hummer, NP   100 mg at 10/05/15 2117    Lab Results:  Results for orders placed or performed during the hospital encounter of 10/03/15 (from the past 48 hour(s))  TSH     Status: None   Collection Time: 10/05/15  6:27 AM  Result Value Ref Range   TSH 1.978 0.350 - 4.500 uIU/mL    Comment: Performed at [facility]  Lipid panel     Status: Abnormal   Collection Time: 10/05/15  6:27 AM  Result Value Ref Range   Cholesterol 223 (H) 0 - 200 mg/dL   Triglycerides 175 (H) <150 mg/dL   HDL 65 >40 mg/dL   Total CHOL/HDL Ratio 3.4 RATIO   VLDL 35 0 - 40 mg/dL   LDL Cholesterol 123 (H) 0 - 99 mg/dL    Comment:        Total Cholesterol/HDL:CHD Risk Coronary Heart Disease Risk Table  Men    Women  1/2 Average Risk   3.4   3.3  Average Risk       5.0   4.4  2 X Average Risk   9.6   7.1  3 X Average Risk  23.4   11.0        Use the calculated Patient Ratio above and the CHD Risk Table to determine the patient's CHD Risk.        ATP III CLASSIFICATION (LDL):  <100     mg/dL   Optimal  100-129  mg/dL   Near or Above                    Optimal  130-159  mg/dL   Borderline  160-189  mg/dL   High  >190     mg/dL   Very High Performed at [facility]   Hemoglobin A1c     Status: None   Collection Time: 10/05/15  6:27 AM  Result Value Ref Range   Hgb A1c MFr Bld 5.3 4.8 - 5.6 %    Comment: (NOTE)         Pre-diabetes: 5.7 - 6.4         Diabetes: >6.4         Glycemic control for adults with diabetes: <7.0    Mean Plasma Glucose 105 mg/dL    Comment: (NOTE) Performed At: [facility] [place], [place] [id number] Lindon Romp MD [id number] Performed at [facility]     Blood Alcohol level:  No results found for: [facility]  Metabolic Disorder Labs: Lab Results  Component Value Date   HGBA1C 5.3 10/05/2015   MPG 105 10/05/2015   No results found for: PROLACTIN Lab Results  Component Value Date   CHOL 223 (H) 10/05/2015   TRIG 175 (H) 10/05/2015   HDL 65 10/05/2015   CHOLHDL 3.4 10/05/2015   VLDL 35 10/05/2015   LDLCALC 123 (H) 10/05/2015    Physical Findings: AIMS: Facial and Oral Movements Muscles of Facial Expression: None, normal Lips and Perioral Area: None, normal Jaw: None, normal Tongue: None, normal,Extremity Movements Upper (arms, wrists, hands, fingers): None, normal Lower (legs, knees, ankles, toes): None, normal, Trunk Movements Neck, shoulders, hips: None, normal, Overall Severity Severity of abnormal movements (highest score from questions above): None, normal Incapacitation due to abnormal movements: None, normal Patient's awareness of abnormal movements (rate only patient's report): No  Awareness, Dental Status Current problems with teeth and/or dentures?: No Does patient usually wear dentures?: No  CIWA:    COWS:     Musculoskeletal: Strength & Muscle Tone: within normal limits Gait & Station: normal Patient leans: N/A  Psychiatric Specialty Exam: Physical Exam  Nursing note and vitals reviewed. Constitutional:  I concur with PE done in ED    Review of Systems  Musculoskeletal: Positive for back pain, joint pain and neck pain.  Psychiatric/Behavioral: Positive for depression. The patient is nervous/anxious.   All other systems reviewed and are negative.   Blood pressure 121/88, pulse 97, temperature 98.5 F (36.9 C), temperature source Oral, resp. rate 20, height 5' 1.5" (1.562 m), weight 75.8 kg (167 lb).Body mass index is 31.04 kg/m.  General Appearance: Fairly Groomed  Eye Contact:  Fair  Speech:  Normal Rate  Volume:  Decreased  Mood:  Anxious, Depressed and Dysphoric  Affect:  Depressed  Thought Process:  Goal Directed and Descriptions of Associations: Circumstantial  Orientation:  Full (Time,  Place, and Person)  Thought Content:  Rumination  Suicidal Thoughts:  contracts for safety on the unit  Homicidal Thoughts:  No  Memory:  Immediate;   Fair Recent;   Fair Remote;   Fair  Judgement:  Impaired  Insight:  Shallow  Psychomotor Activity:  Restlessness  Concentration:  Concentration: Fair and Attention Span: Fair  Recall:  [employer] of Knowledge:  Fair  Language:  Fair  Akathisia:  No  Handed:  Right  AIMS (if indicated):     Assets:  Communication Skills Social Support  ADL's:  Intact  Cognition:  WNL  Sleep:  Number of Hours: 6.75     Treatment Plan Summary: Shirley Williams a 60 y.o.caucasian femalewho is married , lives in [place] with her husband , has a hx of depression, presented voluntarily to [facility] on 09/30/2015. She was subsequently IVC'd.  Patient decompensated on the anniversary of her  friends's death who committed suicide and also her dog had died days ago. Pt continues to need treatment.   Daily contact with patient to assess and evaluate symptoms and progress in treatment and Medication management Will continue to taper off Effexor xr and increase Prozac to 40 mg po daily for depressive sx/anxiety sx. Will continue Abilify 5 mg po qhs for augmenting the effect of Prozac. Will continue Trazodone 100 mg po qhs for sleep. Will make available Klonopin 1 mg po daily prn for panic sx. Will continue Vistaril 25 mg po prn for anxiety sx. Restarted home medications where indicated. Will continue to monitor vitals ,medication compliance and treatment side effects while patient is here.  Will monitor for medical issues as well as call consult as needed. Will get PT consult today. Reviewed labs uds- pos for thc, bzd, cbc - wnl, cmp -wnl, tsh- wnl , lipid panel- abnormal - recommend diet control , hba1c- 5.3. EKG - qtc - wnl . CSW will continue working on disposition.  Patient to participate in therapeutic milieu .     Gradie Butrick, MD 10/06/2015, 3:22 PM

## 2015-10-06 NOTE — Progress Notes (Signed)
PT Note  Patient Details Name: Shirley Williams MRN: [id number] DOB: 21-Sep-1955  Contacted nurse to plan our PT visit. Will head over after patient's lunch today. Thank you.      Clide Dales 10/06/2015, 12:15 PM  Clide Dales, PT Pager: [phone] 10/06/2015

## 2015-10-06 NOTE — Progress Notes (Signed)
Recreation Therapy Notes  10/06/15 1430:  LRT met with pt about coping skills.  Pt was bright and engaged in the conversation.  Pt stated she was here because "my husband was getting on my nerves and I needed a break".  Pt expressed she has experienced a number of losses over the past year, including the passing of her dog, her father, and a friend that committed suicide.  LRT asked pt about some of the coping skills she uses to get her through her tough periods, pt stated she uses deep breathing, going outside, cooking and doing exercises to keep her knees from getting stiff.  LRT will follow up with the pt.  Victorino Sparrow, LRT/CTRS    Victorino Sparrow A 10/06/2015 3:41 PM

## 2015-10-06 NOTE — Evaluation (Signed)
Physical Therapy  Patient Details Name: BRYLYN HOLTMAN MRN: [id number] DOB: 08-14-1955 Today's Date: 10/06/2015  Patient chart reviewed and spoke with patient at length. Pt claims R TKA in [place] 12 weeks ago, and she has graduated from [employer]. She demonstrated her ROM in standing with >90 degrees flexion, and performed wall squat. She decribes her exercise program of straight leg raises, knee flexion and knee extension exercises appropriately. She also is looking forward to getting home to her recumbent bike for her knees and her treadmill. Her swelling is not visible to me, she just feels tightness with the weather changes which is not abnormal.   Pt with no PT needs at this time and she agrees as well. Discussed this with patient's nurse.     Clide Dales 10/06/2015, 3:45 PM  Clide Dales, PT Pager: [phone] 10/06/2015

## 2015-10-06 NOTE — Progress Notes (Signed)
Patient attended karaoke group tonight.  

## 2015-10-07 MED ORDER — TOPIRAMATE 50 MG PO TABS: 50.0000 mg | ORAL_TABLET | Freq: Every day | ORAL | 0 refills | Status: DC

## 2015-10-07 MED ORDER — ATORVASTATIN CALCIUM 40 MG PO TABS: 40.0000 mg | ORAL_TABLET | Freq: Every evening | ORAL | Status: DC

## 2015-10-07 MED ORDER — HYDROXYZINE HCL 25 MG PO TABS: 25.0000 mg | ORAL_TABLET | Freq: Four times a day (QID) | ORAL | 0 refills | Status: DC | PRN

## 2015-10-07 MED ORDER — FLUOXETINE HCL 40 MG PO CAPS: 40.0000 mg | ORAL_CAPSULE | Freq: Every day | ORAL | 0 refills | Status: DC

## 2015-10-07 MED ORDER — FLUTICASONE PROPIONATE 50 MCG/ACT NA SUSP: 2.0000 | Freq: Every evening | NASAL | 0 refills | Status: DC

## 2015-10-07 MED ORDER — CLONAZEPAM 1 MG PO TABS: 1.0000 mg | ORAL_TABLET | Freq: Every day | ORAL | 0 refills | Status: DC | PRN

## 2015-10-07 MED ORDER — FERROUS SULFATE 325 (65 FE) MG PO TABS: 325.0000 mg | ORAL_TABLET | Freq: Every day | ORAL | 0 refills | Status: DC

## 2015-10-07 MED ORDER — DICLOFENAC SODIUM 75 MG PO TBEC: 75.0000 mg | DELAYED_RELEASE_TABLET | Freq: Two times a day (BID) | ORAL | Status: DC

## 2015-10-07 MED ORDER — NICOTINE 21 MG/24HR TD PT24: 21.0000 mg | MEDICATED_PATCH | Freq: Every day | TRANSDERMAL | 0 refills | Status: DC

## 2015-10-07 MED ORDER — TRAMADOL HCL 50 MG PO TABS: 50.0000 mg | ORAL_TABLET | Freq: Four times a day (QID) | ORAL | Status: DC | PRN

## 2015-10-07 MED ORDER — ALBUTEROL SULFATE HFA 108 (90 BASE) MCG/ACT IN AERS: 2.0000 | INHALATION_SPRAY | RESPIRATORY_TRACT | Status: DC | PRN

## 2015-10-07 MED ORDER — TRAZODONE HCL 100 MG PO TABS: 100.0000 mg | ORAL_TABLET | Freq: Every day | ORAL | 0 refills | Status: DC

## 2015-10-07 MED ORDER — ARIPIPRAZOLE 5 MG PO TABS: 5.0000 mg | ORAL_TABLET | Freq: Every day | ORAL | 0 refills | Status: DC

## 2015-10-07 NOTE — Progress Notes (Signed)
D: Pt has anxious affect and mood.  Her goal is "to make through the day and move forward."  Pt denies SI/HI, denies hallucinations, denies pain.  Pt has been visible in milieu interacting with peers and staff appropriately.  Pt attended evening group.   A:  Met with pt 1:1 and provided support and encouragement.  Actively listened to pt.  Medications administered per order.  PRN medication administered for pain and anxiety. R: Pt is compliant with medications.  Pt verbally contracts for safety.  Will continue to monitor and assess.

## 2015-10-07 NOTE — Progress Notes (Signed)
Recreation Therapy Notes  10/07/15 1050:  LRT met with pt.  Pt was excited about being discharged.  Pt expressed being grateful to all the staff for the help she received while here.  LRT encouraged pt to continue using her coping skills and the lessons she learned in the various groups when she is faced with a difficult situation.    , LRT/CTRS    ,  A 10/07/2015 12:41 PM 

## 2015-10-07 NOTE — Plan of Care (Signed)
Problem: Health Behavior/Discharge Planning: Goal: Compliance with prescribed medication regimen will improve Outcome: Progressing Pt has been compliant with medications during this shift

## 2015-10-07 NOTE — BHH Suicide Risk Assessment (Signed)
[facility] Discharge Suicide Risk Assessment   Principal Problem: MDD (major depressive disorder), recurrent, severe, with psychosis ([place])( improved) Discharge Diagnoses:  Patient Active Problem List   Diagnosis Date Noted  . Chronic pain syndrome [G89.4] 10/04/2015  . MDD (major depressive disorder), recurrent, severe, with psychosis ([place]) [F33.3] 10/03/2015  . Acute appendicitis [K35.80] 01/15/2012    Total Time spent with patient: 30 minutes  Musculoskeletal: Strength & Muscle Tone: within normal limits Gait & Station: normal Patient leans: N/A  Psychiatric Specialty Exam: Review of Systems  Psychiatric/Behavioral: Negative for depression. The patient is not nervous/anxious.   All other systems reviewed and are negative.   Blood pressure 135/88, pulse 92, temperature 97.7 F (36.5 C), temperature source Oral, resp. rate 18, height 5' 1.5" (1.562 m), weight 75.8 kg (167 lb).Body mass index is 31.04 kg/m.  General Appearance: Casual  Eye Contact::  Fair  Speech:  Clear and Coherent409  Volume:  Normal  Mood:  Euthymic  Affect:  Appropriate  Thought Process:  Goal Directed and Descriptions of Associations: Intact  Orientation:  Full (Time, Place, and Person)  Thought Content:  Logical  Suicidal Thoughts:  No  Homicidal Thoughts:  No  Memory:  Immediate;   Fair Recent;   Fair Remote;   Fair  Judgement:  Fair  Insight:  Fair  Psychomotor Activity:  Normal  Concentration:  Fair  Recall:  [employer] of Knowledge:Fair  Language: Fair  Akathisia:  No  Handed:  Right  AIMS (if indicated):   0  Assets:  Desire for Improvement  Sleep:  Number of Hours: 5.75  Cognition: WNL  ADL's:  Intact   Mental Status Per Nursing Assessment::   On Admission:  NA  Demographic Factors:  Caucasian  Loss Factors: Loss of significant relationship and Decline in physical health  Historical Factors: Impulsivity  Risk Reduction Factors:   Living with another person, especially a  relative, Positive social support and Positive therapeutic relationship  Continued Clinical Symptoms:  Chronic Pain Previous Psychiatric Diagnoses and Treatments  Cognitive Features That Contribute To Risk:  None    Suicide Risk:  Minimal: No identifiable suicidal ideation.  Patients presenting with no risk factors but with morbid ruminations; may be classified as minimal risk based on the severity of the depressive symptoms  Follow-up Information    [facility] Follow up on 10/10/2015.   Why:  Monday at 2:15.  Bring ID, SS card Contact information: [facility] Follow up on 10/13/2015.   Why:  Thursday at 3:00 Contact information: [place] [facility]/Follow-up recommendations:  Activity:  no restrictions Diet:  regular Tests:  as needed Other:  follow up with aftercare  Darcie Mellone, MD 10/07/2015, 9:51 AM

## 2015-10-07 NOTE — Progress Notes (Signed)
Patient discharged per physician order; patient denies SI/HI and A/V hallucinations; patient received prescriptions,  AVS, transition record, and discharge suicide risk assessment note, and  given to the patient after it was reviewed; patient had no other questions or concerns at this time; patient verbalized and signed that all belongings were returned; patient left the unit ambulatory

## 2015-10-07 NOTE — Tx Team (Signed)
Interdisciplinary Treatment Plan Update (Adult)  Date:  10/07/2015   Time Reviewed:  11:42 AM   Progress in Treatment: Attending groups: Yes. Participating in groups:  Yes. Taking medication as prescribed:  Yes. Tolerating medication:  Yes. Family/Significant other contact made:  Yes Patient understands diagnosis:  Yes  As evidenced by seeking help with depression Discussing patient identified problems/goals with staff:  Yes, see initial care plan. Medical problems stabilized or resolved:  Yes. Denies suicidal/homicidal ideation: Yes. Issues/concerns per patient self-inventory:  No. Other:  New problem(s) identified:  Discharge Plan or Barriers: see below  Reason for Continuation of Hospitalization:   Comments:   Pt presents as very depressed AEB slow speech and excessive crying. Pt has had several deaths in her family within the last couple of months, including her dog. Pt is worried that her husband, who she indicates is very sick (he has RA), will leave her. Pt also reports seeing shadows and hearing people talking to her husband. Pt endorses SI with a passive plan to OD on her medications. Pt reports decreased sleep and appetite x 1 month. Will continue to taper off Effexor xr and increase Prozac to 40 mg po daily for depressive sx/anxiety sx. Will continue Abilify 5 mg po qhs for augmenting the effect of Prozac. Will continue Trazodone 100 mg po qhs for sleep. Will make available Klonopin 1 mg po daily prn for panic sx. Will continue Vistaril 25 mg po prn for anxiety sx.  Estimated length of stay: D/C today  New goal(s):  Review of initial/current patient goals per problem list:   Review of initial/current patient goals per problem list:  1. Goal(s): Patient will participate in aftercare plan   Met: Yes   Target date: 3-5 days post admission date   As evidenced by: Patient will participate within aftercare plan AEB aftercare provider and housing plan at discharge  being identified. 10/05/15:  Return home, follow up outpt   2. Goal (s): Patient will exhibit decreased depressive symptoms and suicidal ideations.   Met: Yes   Target date: 3-5 days post admission date   As evidenced by: Patient will utilize self rating of depression at 3 or below and demonstrate decreased signs of depression or be deemed stable for discharge by MD. 10/05/15:  Rates depression a 4 today 10/07/15:  Denies depression today      5. Goal(s): Patient will demonstrate decreased signs of psychosis  * Met: Yes  * Target date: 3-5 days post admission date  * As evidenced by: Patient will demonstrate decreased frequency of AVH or return to baseline function 10/05/15:  Denies psychosis today.  States she has had no incidence since prior to admission          Attendees: Patient:  10/07/2015 11:42 AM   Family:   10/07/2015 11:42 AM   Physician:  Ursula Alert, MD 10/07/2015 11:42 AM   Nursing:   Marilynne Halsted, RN 10/07/2015 11:42 AM   CSW:    Roque Lias, LCSW   10/07/2015 11:42 AM   Other:  10/07/2015 11:42 AM   Other:   10/07/2015 11:42 AM   Other:  Lars Pinks, Nurse CM 10/07/2015 11:42 AM   Other:   10/07/2015 11:42 AM   Other:  Norberto Sorenson, [place]  10/07/2015 11:42 AM   Other:  10/07/2015 11:42 AM   Other:  10/07/2015 11:42 AM   Other:  10/07/2015 11:42 AM   Other:  10/07/2015 11:42 AM   Other:  10/07/2015 11:42 AM  Other:   10/07/2015 11:42 AM    Scribe for Treatment Team:   Trish Mage, 10/07/2015 11:42 AM

## 2015-10-07 NOTE — Discharge Summary (Signed)
Physician Discharge Summary Note  Patient:  Shirley Williams is an 61 y.o., female MRN:  [id number] DOB:  1955-10-21 Patient phone:  [phone] (home)  Patient address:   [place] [address],  Total Time spent with patient: Greater than 30 minutes  Date of Admission:  10/03/2015 Date of Discharge: 10-07-15  Reason for Admission: Worsening symptoms of depression with psychosis.  Principal Problem: MDD (major depressive disorder), recurrent, severe, with psychosis [facility])  Discharge Diagnoses: Patient Active Problem List   Diagnosis Date Noted  . Chronic pain syndrome [G89.4] 10/04/2015  . MDD (major depressive disorder), recurrent, severe, with psychosis ([facility]) [F33.3] 10/03/2015  . Acute appendicitis [K35.80] 01/15/2012   Past Psychiatric History: Mdd  Past Medical History:  Past Medical History:  Diagnosis Date  . Arthritis   . Depression   . GERD (gastroesophageal reflux disease)     Past Surgical History:  Procedure Laterality Date  . APPENDECTOMY    . LAPAROSCOPIC APPENDECTOMY  01/15/2012   Procedure: APPENDECTOMY LAPAROSCOPIC;  Surgeon: Imogene Burn. Georgette Dover, MD;  Location: [facility] OR;  Service: General;  Laterality: N/A;  . TONSILLECTOMY     Family History:  Family History  Problem Relation Age of Onset  . COPD Mother   . Cancer Father     brain  . Asthma Sister   . Depression Sister    Family Psychiatric  History: See H&P  Social History:  History  Alcohol Use  . Yes    Comment: occ     History  Drug Use No    Social History   Social History  . Marital status: Married    Spouse name: N/A  . Number of children: N/A  . Years of education: N/A   Social History Main Topics  . Smoking status: Former Smoker    Packs/day: 1.00  . Smokeless tobacco: Never Used  . Alcohol use Yes     Comment: occ  . Drug use: No  . Sexual activity: Yes    Birth control/ protection: Post-menopausal   Other Topics Concern  . None   Social History  Narrative  . None   Hospital Course:  Shirley Mcfadyen Masonis a 60 y.o.caucasian femalewho is married, lives in [place] with her husband, has a hx of depression, presented [facility] to [facility] on 09/30/[facility]. She was subsequently IVC'd. "Pt presents as very depressed AEB slow speech and excessive crying. Pt has had several deaths in her family within the last couple of months, including her dog. Pt is worried that her husband, who she indicates is very sick (he has RA), will leave her. Pt also reports seeing shadows and hearing people talking to her husband. Pt endorses SI with a passive plan to OD on her medications. Pt reports decreased sleep and appetite x 1 month. Pt sees Dr. Franz Dell for therapy and her PCP, Dr. Greig Right for medication management.  Shirley Williams was admitted to the [facility] adult unit for mood stabilization treatments. She was experiencing worsening symptoms of depression triggered by recent deaths of family members. She was also presenting with suicidal ideations with plans to overdose on her medications both auditory & visual hallucinations. She was in need of mood stabilization treatments.   After evaluation of her symptoms, Shirley Williams was started on medication regimen targeting her presenting symptoms (see MAR). She was also enrolled in the group milieu being offered & held on this unit. She was taught & leaned coping skills that should held her cope  better after discharge. She was resumed on all her pertinent home medications for her other pre-existing medical issues presented. Shirley Williams tolerated her treatment regimen without any adverse effects or reactions reported.  Shirley Williams's symptoms responded well to her treatment regimen. This is evidenced by her reports of improved mood, absence of AVH & SI. She is currently being discharged to continue further mental health care on an outpatient basis as noted below. She left [facility] with all belongings in no apparent distress.  Transportation per family.  Physical Findings: AIMS: Facial and Oral Movements Muscles of Facial Expression: None, normal Lips and Perioral Area: None, normal Jaw: None, normal Tongue: None, normal,Extremity Movements Upper (arms, wrists, hands, fingers): None, normal Lower (legs, knees, ankles, toes): None, normal, Trunk Movements Neck, shoulders, hips: None, normal, Overall Severity Severity of abnormal movements (highest score from questions above): None, normal Incapacitation due to abnormal movements: None, normal Patient's awareness of abnormal movements (rate only patient's report): No Awareness, Dental Status Current problems with teeth and/or dentures?: No Does patient usually wear dentures?: No  CIWA:    COWS:     Musculoskeletal: Strength & Muscle Tone: within normal limits Gait & Station: normal Patient leans: N/A  Psychiatric Specialty Exam: Physical Exam  Constitutional: She is oriented to person, place, and time. She appears well-developed.  HENT:  Head: Normocephalic.  Eyes: Pupils are equal, round, and reactive to light.  Neck: Normal range of motion.  Cardiovascular: Normal rate.   Respiratory: Effort normal.  GI: Soft.  Genitourinary:  Genitourinary Comments: Denies any issues in this area  Musculoskeletal: Normal range of motion.  Neurological: She is alert and oriented to person, place, and time.  Skin: Skin is warm and dry.    Review of Systems  Constitutional: Negative.   HENT: Negative.   Eyes: Negative.   Respiratory: Negative.   Cardiovascular: Negative.   Gastrointestinal: Negative.   Genitourinary: Negative.   Musculoskeletal: Negative.   Skin: Negative.   Neurological: Negative.   Endo/Heme/Allergies: Negative.   Psychiatric/Behavioral: Positive for depression (Stable) and hallucinations (Hx. auditory hallucinations). Negative for memory loss, substance abuse and suicidal ideas. The patient has insomnia (Stable). The patient is not  nervous/anxious.     Blood pressure 135/88, pulse 92, temperature 97.7 F (36.5 C), temperature source Oral, resp. rate 18, height 5' 1.5" (1.562 m), weight 75.8 kg (167 lb).Body mass index is 31.04 kg/m.  See Md's SRA   Blood Alcohol level:  No results found for: [facility]  Metabolic Disorder Labs:  Lab Results  Component Value Date   HGBA1C 5.3 10/05/2015   MPG 105 10/05/2015   No results found for: PROLACTIN Lab Results  Component Value Date   CHOL 223 (H) 10/05/2015   TRIG 175 (H) 10/05/2015   HDL 65 10/05/2015   CHOLHDL 3.4 10/05/2015   VLDL 35 10/05/2015   LDLCALC 123 (H) 10/05/2015   See Psychiatric Specialty Exam and Suicide Risk Assessment completed by Attending Physician prior to discharge.  Discharge destination:  Home  Is patient on multiple antipsychotic therapies at discharge:  No   Has Patient had three or more failed trials of antipsychotic monotherapy by history:  No  Recommended Plan for Multiple Antipsychotic Therapies: NA    Medication List    STOP taking these medications   ALLERGY EYE DROPS OP   esomeprazole 40 MG capsule Commonly known as:  NEXIUM   GLUCOSAMINE COMPLEX PO   multivitamin with minerals Tabs tablet   ondansetron 4 MG tablet Commonly known as:  ZOFRAN   scopolamine 1 MG/3DAYS Commonly known as:  TRANSDERM-SCOP   triamcinolone cream 0.1 % Commonly known as:  KENALOG   venlafaxine XR 150 MG 24 hr capsule Commonly known as:  EFFEXOR-XR     TAKE these medications     Indication  albuterol 108 (90 Base) MCG/ACT inhaler Commonly known as:  PROVENTIL HFA;VENTOLIN HFA Inhale 2 puffs into the lungs every 4 (four) hours as needed for wheezing or shortness of breath.  Indication:  Asthma   ARIPiprazole 5 MG tablet Commonly known as:  ABILIFY Take 1 tablet (5 mg total) by mouth at bedtime. For mood control  Indication:  Mood control   atorvastatin 40 MG tablet Commonly known as:  LIPITOR Take 1 tablet (40 mg total) by mouth  every evening. Fir high Cholesterol/fat What changed:  additional instructions  Indication:  Inherited Homozygous Hypercholesterolemia, Increased Fats, Triglycerides & Cholesterol in the Blood   clonazePAM 1 MG tablet Commonly known as:  KLONOPIN Take 1 tablet (1 mg total) by mouth daily as needed (for panic attacks). For panic attacks  Indication:  Panic attacks   diclofenac 75 MG EC tablet Commonly known as:  VOLTAREN Take 1 tablet (75 mg total) by mouth 2 (two) times daily. For osteo arthritis What changed:  additional instructions  Indication:  Joint Damage causing Pain and Loss of Function   ferrous sulfate 325 (65 FE) MG tablet Take 1 tablet (325 mg total) by mouth daily with breakfast. For low iron  Indication:  Iron Deficiency   FLUoxetine 40 MG capsule Commonly known as:  PROZAC Take 1 capsule (40 mg total) by mouth daily. For depression  Indication:  Major Depressive Disorder   fluticasone 50 MCG/ACT nasal spray Commonly known as:  FLONASE Place 2 sprays into both nostrils every evening. For allergies What changed:  additional instructions  Indication:  Allergic Rhinitis, Signs and Symptoms of Nose Diseases   hydrOXYzine 25 MG tablet Commonly known as:  ATARAX/VISTARIL Take 1 tablet (25 mg total) by mouth every 6 (six) hours as needed for anxiety.  Indication:  Anxiety Neurosis   nicotine 21 mg/24hr patch Commonly known as:  NICODERM CQ - dosed in mg/24 hours Place 1 patch (21 mg total) onto the skin daily. For smoking cessation  Indication:  Nicotine Addiction   topiramate 50 MG tablet Commonly known as:  TOPAMAX Take 1 tablet (50 mg total) by mouth daily. For mood stabilization What changed:  how much to take  when to take this  additional instructions  Indication:  Mood stabilization   traMADol 50 MG tablet Commonly known as:  ULTRAM Take 1-2 tablets (50-100 mg total) by mouth every 6 (six) hours as needed for moderate pain.  Indication:  Moderate  to Moderately Severe Pain   traZODone 100 MG tablet Commonly known as:  DESYREL Take 1 tablet (100 mg total) by mouth at bedtime. For sleep What changed:  additional instructions  Indication:  Trouble Sleeping      Follow-up Information    [facility] Follow up on 10/10/2015.   Why:  Monday at 2:15.  Bring ID, SS card Contact information: [facility] Follow up on 10/13/2015.   Why:  Thursday at 3:00 Contact information: [place]  [phone]         Follow-up recommendations: Activity:  As tolerated Diet: As recommended by your primary care doctor. Keep all  scheduled follow-up appointments as recommended.   Comments: Patient is instructed prior to discharge to: Take all medications as prescribed by his/her mental healthcare provider. Report any adverse effects and or reactions from the medicines to his/her outpatient provider promptly. Patient has been instructed & cautioned: To not engage in alcohol and or illegal drug use while on prescription medicines. In the event of worsening symptoms, patient is instructed to call the crisis hotline, 911 and or go to the nearest ED for appropriate evaluation and treatment of symptoms. To follow-up with his/her primary care provider for your other medical issues, concerns and or health care needs.   Signed: Encarnacion Slates, NP, PMHNP, FNP-BC 10/09/2015, 4:25 PM

## 2015-12-07 DIAGNOSIS — H73892 Other specified disorders of tympanic membrane, left ear: Secondary | ICD-10-CM | POA: Insufficient documentation

## 2015-12-07 DIAGNOSIS — H6993 Unspecified Eustachian tube disorder, bilateral: Secondary | ICD-10-CM | POA: Insufficient documentation

## 2015-12-07 DIAGNOSIS — H6983 Other specified disorders of Eustachian tube, bilateral: Secondary | ICD-10-CM | POA: Insufficient documentation

## 2015-12-07 HISTORY — DX: Other specified disorders of tympanic membrane, left ear: H73.892

## 2015-12-07 HISTORY — DX: Unspecified eustachian tube disorder, bilateral: H69.93

## 2015-12-07 HISTORY — DX: Other specified disorders of eustachian tube, bilateral: H69.83

## 2016-04-03 DIAGNOSIS — H748X2 Other specified disorders of left middle ear and mastoid: Secondary | ICD-10-CM | POA: Insufficient documentation

## 2016-04-03 HISTORY — DX: Other specified disorders of left middle ear and mastoid: H74.8X2

## 2016-07-03 DIAGNOSIS — F609 Personality disorder, unspecified: Secondary | ICD-10-CM | POA: Insufficient documentation

## 2016-07-03 DIAGNOSIS — F3341 Major depressive disorder, recurrent, in partial remission: Secondary | ICD-10-CM

## 2016-07-03 DIAGNOSIS — F410 Panic disorder [episodic paroxysmal anxiety] without agoraphobia: Secondary | ICD-10-CM

## 2016-07-03 HISTORY — DX: Major depressive disorder, recurrent, in partial remission: F33.41

## 2016-07-03 HISTORY — DX: Panic disorder (episodic paroxysmal anxiety): F41.0

## 2016-07-03 HISTORY — DX: Personality disorder, unspecified: F60.9

## 2016-07-23 DIAGNOSIS — Z96651 Presence of right artificial knee joint: Secondary | ICD-10-CM | POA: Insufficient documentation

## 2016-07-23 HISTORY — DX: Presence of right artificial knee joint: Z96.651

## 2016-09-17 DIAGNOSIS — M2392 Unspecified internal derangement of left knee: Secondary | ICD-10-CM

## 2016-09-17 HISTORY — DX: Unspecified internal derangement of left knee: M23.92

## 2017-03-06 DIAGNOSIS — M1712 Unilateral primary osteoarthritis, left knee: Secondary | ICD-10-CM

## 2017-03-06 HISTORY — DX: Unilateral primary osteoarthritis, left knee: M17.12

## 2017-03-12 DIAGNOSIS — Z6831 Body mass index (BMI) 31.0-31.9, adult: Secondary | ICD-10-CM

## 2017-03-12 HISTORY — DX: Body mass index (BMI) 31.0-31.9, adult: Z68.31

## 2017-10-07 DIAGNOSIS — R030 Elevated blood-pressure reading, without diagnosis of hypertension: Secondary | ICD-10-CM

## 2017-10-07 HISTORY — DX: Elevated blood-pressure reading, without diagnosis of hypertension: R03.0

## 2018-04-30 ENCOUNTER — Ambulatory Visit: Payer: Self-pay | Admitting: Cardiology

## 2018-05-13 ENCOUNTER — Encounter: Payer: Self-pay | Admitting: Cardiology

## 2018-05-13 ENCOUNTER — Ambulatory Visit (INDEPENDENT_AMBULATORY_CARE_PROVIDER_SITE_OTHER): Payer: Medicaid Other | Admitting: Cardiology

## 2018-05-13 VITALS — BP 118/80 | HR 75 | Ht 61.5 in | Wt 167.0 lb

## 2018-05-13 DIAGNOSIS — J45909 Unspecified asthma, uncomplicated: Secondary | ICD-10-CM | POA: Insufficient documentation

## 2018-05-13 DIAGNOSIS — Z87891 Personal history of nicotine dependence: Secondary | ICD-10-CM | POA: Diagnosis not present

## 2018-05-13 DIAGNOSIS — F329 Major depressive disorder, single episode, unspecified: Secondary | ICD-10-CM

## 2018-05-13 DIAGNOSIS — J449 Chronic obstructive pulmonary disease, unspecified: Secondary | ICD-10-CM | POA: Insufficient documentation

## 2018-05-13 DIAGNOSIS — F32A Depression, unspecified: Secondary | ICD-10-CM

## 2018-05-13 DIAGNOSIS — G43909 Migraine, unspecified, not intractable, without status migrainosus: Secondary | ICD-10-CM | POA: Insufficient documentation

## 2018-05-13 DIAGNOSIS — I10 Essential (primary) hypertension: Secondary | ICD-10-CM

## 2018-05-13 DIAGNOSIS — D649 Anemia, unspecified: Secondary | ICD-10-CM

## 2018-05-13 DIAGNOSIS — F419 Anxiety disorder, unspecified: Secondary | ICD-10-CM | POA: Insufficient documentation

## 2018-05-13 DIAGNOSIS — E785 Hyperlipidemia, unspecified: Secondary | ICD-10-CM | POA: Insufficient documentation

## 2018-05-13 DIAGNOSIS — R0789 Other chest pain: Secondary | ICD-10-CM

## 2018-05-13 DIAGNOSIS — H906 Mixed conductive and sensorineural hearing loss, bilateral: Secondary | ICD-10-CM

## 2018-05-13 DIAGNOSIS — H9312 Tinnitus, left ear: Secondary | ICD-10-CM | POA: Insufficient documentation

## 2018-05-13 DIAGNOSIS — K449 Diaphragmatic hernia without obstruction or gangrene: Secondary | ICD-10-CM

## 2018-05-13 DIAGNOSIS — E782 Mixed hyperlipidemia: Secondary | ICD-10-CM

## 2018-05-13 DIAGNOSIS — M1990 Unspecified osteoarthritis, unspecified site: Secondary | ICD-10-CM | POA: Insufficient documentation

## 2018-05-13 HISTORY — DX: Major depressive disorder, single episode, unspecified: F32.9

## 2018-05-13 HISTORY — DX: Anemia, unspecified: D64.9

## 2018-05-13 HISTORY — DX: Essential (primary) hypertension: I10

## 2018-05-13 HISTORY — DX: Chronic obstructive pulmonary disease, unspecified: J44.9

## 2018-05-13 HISTORY — DX: Diaphragmatic hernia without obstruction or gangrene: K44.9

## 2018-05-13 HISTORY — DX: Tinnitus, left ear: H93.12

## 2018-05-13 HISTORY — DX: Other chest pain: R07.89

## 2018-05-13 HISTORY — DX: Mixed conductive and sensorineural hearing loss, bilateral: H90.6

## 2018-05-13 HISTORY — DX: Hyperlipidemia, unspecified: E78.5

## 2018-05-13 HISTORY — DX: Depression, unspecified: F32.A

## 2018-05-13 HISTORY — DX: Migraine, unspecified, not intractable, without status migrainosus: G43.909

## 2018-05-13 HISTORY — DX: Unspecified asthma, uncomplicated: J45.909

## 2018-05-13 HISTORY — DX: Anxiety disorder, unspecified: F41.9

## 2018-05-13 NOTE — Progress Notes (Signed)
Cardiology Office Note:    Date:  05/13/2018   ID:  Shirley Williams, DOB June 14, 1955, MRN [id number]  PCP:  Greig Right, MD  Cardiologist:  Jenean Lindau, MD   Referring MD: Esperanza Richters, NP    ASSESSMENT:    1. Chest discomfort   2. Mixed hyperlipidemia   3. Essential hypertension   4. Ex-smoker    PLAN:    In order of problems listed above:  1. Primary prevention stressed with the patient.  Importance of compliance with diet and medication stressed and she vocalized understanding.  Her blood pressure is stable.  Diet was discussed for dyslipidemia.  I told her never to go back to smoking and she is agreeable. 2.         Echocardiogram will be done to assess murmur on auscultation. 3.          Patient has multiple risk factors for coronary artery disease and in view of her symptoms she will undergo exercise stress Cardiolite testing.  She has bilateral knee replacements but she feels she can go on the treadmill.  I explained benefits risks and told her that if she wants to do a pharmacologic test she can inform our nuclear techs and we will be glad to do so.  She understands. 4.          Patient will be seen in follow-up appointment in 6 months or earlier if the patient has any concerns    Medication Adjustments/Labs and Tests Ordered: Current medicines are reviewed at length with the patient today.  Concerns regarding medicines are outlined above.  No orders of the defined types were placed in this encounter.  No orders of the defined types were placed in this encounter.    History of Present Illness:    Shirley Williams is a 63 y.o. female who is being seen today for the evaluation of chest discomfort at the request of Wilburn, Nita Sells, NP.  Patient is a pleasant 63 year old female.  She has past medical history of essential hypertension, dyslipidemia and has been a long-term smoker who has quit about 6 months ago.  She mentions to me that she has chest discomfort at  times.  This is not related to exertion.  No radiation of the symptoms to the neck or to the arms.  It is burning-like sensation.  For this reason she is here for evaluation.  Again she has multiple risk factors for coronary artery disease.  She leads a fairly sedentary lifestyle.  Activities of daily living do not reproduce the symptoms.  At the time of my evaluation, the patient is alert awake oriented and in no distress.  Past Medical History:  Diagnosis Date  . Arthritis   . Depression   . GERD (gastroesophageal reflux disease)     Past Surgical History:  Procedure Laterality Date  . APPENDECTOMY    . LAPAROSCOPIC APPENDECTOMY  01/15/2012   Procedure: APPENDECTOMY LAPAROSCOPIC;  Surgeon: Imogene Burn. Tsuei, MD;  Location: [facility];  Service: General;  Laterality: N/A;  . TONSILLECTOMY      Current Medications: Current Meds  Medication Sig  . albuterol (PROVENTIL HFA;VENTOLIN HFA) 108 (90 Base) MCG/ACT inhaler Inhale 2 puffs into the lungs every 4 (four) hours as needed for wheezing or shortness of breath.  Marland Kitchen atorvastatin (LIPITOR) 40 MG tablet Take 1 tablet (40 mg total) by mouth every evening. Fir high Cholesterol/fat  . celecoxib (CELEBREX) 200 MG capsule Take 1 capsule by mouth  2 (two) times daily.  . clonazePAM (KLONOPIN) 1 MG tablet Take 1 tablet (1 mg total) by mouth daily as needed (for panic attacks). For panic attacks  . DULoxetine (CYMBALTA) 60 MG capsule Take 1 capsule by mouth daily.  Marland Kitchen esomeprazole (NEXIUM) 40 MG capsule Take 40 mg by mouth daily at 12 noon.  . fluticasone (FLONASE) 50 MCG/ACT nasal spray Place 2 sprays into both nostrils every evening. For allergies  . losartan (COZAAR) 25 MG tablet Take 25 mg by mouth daily.  . mirtazapine (REMERON) 15 MG tablet Take 1 tablet by mouth daily.  Marland Kitchen topiramate (TOPAMAX) 50 MG tablet Take 1 tablet (50 mg total) by mouth daily. For mood stabilization  . traMADol (ULTRAM) 50 MG tablet Take 1-2 tablets (50-100 mg total) by mouth  every 6 (six) hours as needed for moderate pain.     Allergies:   Sulfa antibiotics; Sulfacetamide sodium; and Tape   Social History   Socioeconomic History  . Marital status: Married    Spouse name: Not on file  . Number of children: Not on file  . Years of education: Not on file  . Highest education level: Not on file  Occupational History  . Not on file  Social Needs  . Financial resource strain: Not on file  . Food insecurity:    Worry: Not on file    Inability: Not on file  . Transportation needs:    Medical: Not on file    Non-medical: Not on file  Tobacco Use  . Smoking status: Former Smoker    Packs/day: 1.00  . Smokeless tobacco: Never Used  Substance and Sexual Activity  . Alcohol use: Yes    Comment: occ  . Drug use: No  . Sexual activity: Yes    Birth control/protection: Post-menopausal  Lifestyle  . Physical activity:    Days per week: Not on file    Minutes per session: Not on file  . Stress: Not on file  Relationships  . Social connections:    Talks on phone: Not on file    Gets together: Not on file    Attends religious service: Not on file    Active member of club or organization: Not on file    Attends meetings of clubs or organizations: Not on file    Relationship status: Not on file  Other Topics Concern  . Not on file  Social History Narrative  . Not on file     Family History: The patient's family history includes Asthma in her sister; COPD in her mother; Cancer in her father; Depression in her sister.  ROS:   Please see the history of present illness.    All other systems reviewed and are negative.  EKGs/Labs/Other Studies Reviewed:    The following studies were reviewed today: EKG reveals sinus rhythm and septal infarct of undetermined age and nonspecific ST-T changes.   Recent Labs: No results found for requested labs within last 8760 hours.  Recent Lipid Panel    Component Value Date/Time   CHOL 223 (H) 10/05/2015 0627    TRIG 175 (H) 10/05/2015 0627   HDL 65 10/05/2015 0627   CHOLHDL 3.4 10/05/2015 0627   VLDL 35 10/05/2015 0627   LDLCALC 123 (H) 10/05/2015 0627    Physical Exam:    VS:  BP 118/80 (BP Location: Right Arm, Patient Position: Sitting, Cuff Size: Normal)   Pulse 75   Ht 5' 1.5" (1.562 m)   Wt 167 lb (75.8 kg)  SpO2 98%   BMI 31.04 kg/m     Wt Readings from Last 3 Encounters:  05/13/18 167 lb (75.8 kg)  02/05/12 160 lb 6.4 oz (72.8 kg)  01/15/12 163 lb (73.9 kg)     GEN: Patient is in no acute distress HEENT: Normal NECK: No JVD; No carotid bruits LYMPHATICS: No lymphadenopathy CARDIAC: S1 S2 regular, 2/6 systolic murmur at the apex. RESPIRATORY:  Clear to auscultation without rales, wheezing or rhonchi  ABDOMEN: Soft, non-tender, non-distended MUSCULOSKELETAL:  No edema; No deformity  SKIN: Warm and dry NEUROLOGIC:  Alert and oriented x 3 PSYCHIATRIC:  Normal affect    Signed, Jenean Lindau, MD  05/13/2018 2:42 PM    [facility]

## 2018-05-13 NOTE — Addendum Note (Signed)
Addended by: Jerl Santos R on: 05/13/2018 05:04 PM   Modules accepted: Orders

## 2018-05-13 NOTE — Patient Instructions (Addendum)
Medication Instructions:  Your physician recommends that you continue on your current medications as directed. Please refer to the Current Medication list given to you today.  If you need a refill on your cardiac medications before your next appointment, please call your pharmacy.   Lab work: NONE If you have labs (blood work) drawn today and your tests are completely normal, you will receive your results only by: Marland Kitchen MyChart Message (if you have MyChart) OR . A paper copy in the mail If you have any lab test that is abnormal or we need to change your treatment, we will call you to review the results.  Testing/Procedures: An EKG was performed today.  Your physician has requested that you have an echocardiogram. Echocardiography is a painless test that uses sound waves to create images of your heart. It provides your doctor with information about the size and shape of your heart and how well your heart's chambers and valves are working. This procedure takes approximately one hour. There are no restrictions for this procedure.  Your physician has requested that you have en exercise stress myoview. For further information please visit [email or web address]. Please follow instruction sheet, as given.   Follow-Up: At [facility], you and your health needs are our priority.  As part of our continuing mission to provide you with exceptional heart care, we have created designated Provider Care Teams.  These Care Teams include your primary Cardiologist (physician) and Advanced Practice Providers (APPs -  Physician Assistants and Nurse Practitioners) who all work together to provide you with the care you need, when you need it. You will need a follow up appointment in 3 months.     Any Other Special Instructions Will Be Listed Below    Echocardiogram An echocardiogram is a procedure that uses painless sound waves (ultrasound) to produce an image of the heart. Images from an echocardiogram can  provide important information about:  Signs of coronary artery disease (CAD).  Aneurysm detection. An aneurysm is a weak or damaged part of an artery wall that bulges out from the normal force of blood pumping through the body.  Heart size and shape. Changes in the size or shape of the heart can be associated with certain conditions, including heart failure, aneurysm, and CAD.  Heart muscle function.  Heart valve function.  Signs of a past heart attack.  Fluid buildup around the heart.  Thickening of the heart muscle.  A tumor or infectious growth around the heart valves. Tell a health care provider about:  Any allergies you have.  All medicines you are taking, including vitamins, herbs, eye drops, creams, and over-the-counter medicines.  Any blood disorders you have.  Any surgeries you have had.  Any medical conditions you have.  Whether you are pregnant or may be pregnant. What are the risks? Generally, this is a safe procedure. However, problems may occur, including:  Allergic reaction to dye (contrast) that may be used during the procedure. What happens before the procedure? No specific preparation is needed. You may eat and drink normally. What happens during the procedure?   An IV tube may be inserted into one of your veins.  You may receive contrast through this tube. A contrast is an injection that improves the quality of the pictures from your heart.  A gel will be applied to your chest.  A wand-like tool (transducer) will be moved over your chest. The gel will help to transmit the sound waves from the transducer.  The sound waves  will harmlessly bounce off of your heart to allow the heart images to be captured in real-time motion. The images will be recorded on a computer. The procedure may vary among health care providers and hospitals. What happens after the procedure?  You may return to your normal, everyday life, including diet, activities, and  medicines, unless your health care provider tells you not to do that. Summary  An echocardiogram is a procedure that uses painless sound waves (ultrasound) to produce an image of the heart.  Images from an echocardiogram can provide important information about the size and shape of your heart, heart muscle function, heart valve function, and fluid buildup around your heart.  You do not need to do anything to prepare before this procedure. You may eat and drink normally.  After the echocardiogram is completed, you may return to your normal, everyday life, unless your health care provider tells you not to do that. This information is not intended to replace advice given to you by your health care provider. Make sure you discuss any questions you have with your health care provider. Document Released: 02/17/2000 Document Revised: 03/24/2016 Document Reviewed: 03/24/2016 Elsevier Interactive Patient Education  2019 [employer].  Cardiac Nuclear Scan A cardiac nuclear scan is a test that is done to check the flow of blood to your heart. It is done when you are resting and when you are exercising. The test looks for problems such as:  Not enough blood reaching a portion of the heart.  The heart muscle not working as it should. You may need this test if:  You have heart disease.  You have had lab results that are not normal.  You have had heart surgery or a balloon procedure to open up blocked arteries (angioplasty).  You have chest pain.  You have shortness of breath. In this test, a special dye (tracer) is put into your bloodstream. The tracer will travel to your heart. A camera will then take pictures of your heart to see how the tracer moves through your heart. This test is usually done at a hospital and takes 2-4 hours. Tell a doctor about:  Any allergies you have.  All medicines you are taking, including vitamins, herbs, eye drops, creams, and over-the-counter medicines.  Any  problems you or family members have had with anesthetic medicines.  Any blood disorders you have.  Any surgeries you have had.  Any medical conditions you have.  Whether you are pregnant or may be pregnant. What are the risks? Generally, this is a safe test. However, problems may occur, such as:  Serious chest pain and heart attack. This is only a risk if the stress portion of the test is done.  Rapid heartbeat.  A feeling of warmth in your chest. This feeling usually does not last long.  Allergic reaction to the tracer. What happens before the test?  Ask your doctor about changing or stopping your normal medicines. This is important.  Follow instructions from your doctor about what you cannot eat or drink.  Remove your jewelry on the day of the test. What happens during the test?  An IV tube will be inserted into one of your veins.  Your doctor will give you a small amount of tracer through the IV tube.  You will wait for 20-40 minutes while the tracer moves through your bloodstream.  Your heart will be monitored with an electrocardiogram (ECG).  You will lie down on an exam table.  Pictures of your  heart will be taken for about 15-20 minutes.  You may also have a stress test. For this test, one of these things may be done: ? You will be asked to exercise on a treadmill or a stationary bike. ? You will be given medicines that will make your heart work harder. This is done if you are unable to exercise.  When blood flow to your heart has peaked, a tracer will again be given through the IV tube.  After 20-40 minutes, you will get back on the exam table. More pictures will be taken of your heart.  Depending on the tracer that is used, more pictures may need to be taken 3-4 hours later.  Your IV tube will be removed when the test is over. The test may vary among doctors and hospitals. What happens after the test?  Ask your doctor: ? Whether you can return to your  normal schedule, including diet, activities, and medicines. ? Whether you should drink more fluids. This will help to remove the tracer from your body. Drink enough fluid to keep your pee (urine) pale yellow.  Ask your doctor, or the department that is doing the test: ? When will my results be ready? ? How will I get my results? Summary  A cardiac nuclear scan is a test that is done to check the flow of blood to your heart.  Tell your doctor whether you are pregnant or may be pregnant.  Before the test, ask your doctor about changing or stopping your normal medicines. This is important.  Ask your doctor whether you can return to your normal activities. You may be asked to drink more fluids. This information is not intended to replace advice given to you by your health care provider. Make sure you discuss any questions you have with your health care provider. Document Released: 08/05/2017 Document Revised: 08/05/2017 Document Reviewed: 08/05/2017 Elsevier Interactive Patient Education  2019 [employer].

## 2018-05-29 ENCOUNTER — Telehealth: Payer: Self-pay | Admitting: Cardiology

## 2018-05-29 ENCOUNTER — Telehealth: Payer: Self-pay | Admitting: *Deleted

## 2018-05-29 NOTE — Telephone Encounter (Signed)
Scheduled for Myoview in [place]

## 2018-05-29 NOTE — Telephone Encounter (Signed)
Canceled nuclear appt

## 2018-06-03 ENCOUNTER — Telehealth: Payer: Self-pay | Admitting: *Deleted

## 2018-06-03 NOTE — Telephone Encounter (Signed)
Cancelled echo, left message on pt's voicemail.

## 2018-06-17 ENCOUNTER — Other Ambulatory Visit: Payer: Self-pay

## 2018-07-31 DIAGNOSIS — G5603 Carpal tunnel syndrome, bilateral upper limbs: Secondary | ICD-10-CM | POA: Insufficient documentation

## 2018-07-31 HISTORY — DX: Carpal tunnel syndrome, bilateral upper limbs: G56.03

## 2018-08-13 ENCOUNTER — Telehealth: Payer: Medicaid Other | Admitting: Cardiology

## 2018-09-10 ENCOUNTER — Telehealth (HOSPITAL_COMMUNITY): Payer: Self-pay | Admitting: *Deleted

## 2018-09-10 NOTE — Telephone Encounter (Signed)
Patient given detailed instructions per Myocardial Perfusion Study Information Sheet for the test on 09/16/18 at 8:00. Patient notified to arrive 15 minutes early and that it is imperative to arrive on time for appointment to keep from having the test rescheduled.  If you need to cancel or reschedule your appointment, please call the office within 24 hours of your appointment. . Patient verbalized understanding.Shirley Williams

## 2018-09-11 ENCOUNTER — Other Ambulatory Visit: Payer: Self-pay

## 2018-09-11 ENCOUNTER — Other Ambulatory Visit (INDEPENDENT_AMBULATORY_CARE_PROVIDER_SITE_OTHER): Payer: Medicaid Other

## 2018-09-11 DIAGNOSIS — R0789 Other chest pain: Secondary | ICD-10-CM

## 2018-09-11 DIAGNOSIS — I10 Essential (primary) hypertension: Secondary | ICD-10-CM | POA: Diagnosis not present

## 2018-09-11 NOTE — Progress Notes (Signed)
Complete echocardiogram has been performed.  Jimmy Carrie Usery RDCS, RVT 

## 2018-09-12 ENCOUNTER — Encounter: Payer: Self-pay | Admitting: *Deleted

## 2018-09-16 ENCOUNTER — Other Ambulatory Visit: Payer: Self-pay | Admitting: Cardiology

## 2018-09-16 ENCOUNTER — Other Ambulatory Visit: Payer: Self-pay

## 2018-09-16 ENCOUNTER — Ambulatory Visit (INDEPENDENT_AMBULATORY_CARE_PROVIDER_SITE_OTHER): Payer: Medicaid Other

## 2018-09-16 DIAGNOSIS — R0789 Other chest pain: Secondary | ICD-10-CM

## 2018-09-16 MED ORDER — TECHNETIUM TC 99M TETROFOSMIN IV KIT
33.0000 | PACK | Freq: Once | INTRAVENOUS | Status: AC | PRN
  Administered 2018-09-16: 33 via INTRAVENOUS

## 2018-09-16 MED ORDER — REGADENOSON 0.4 MG/5ML IV SOLN
0.4000 mg | Freq: Once | INTRAVENOUS | Status: AC
  Administered 2018-09-16: 0.4 mg via INTRAVENOUS

## 2018-09-16 MED ORDER — TECHNETIUM TC 99M TETROFOSMIN IV KIT
11.0000 | PACK | Freq: Once | INTRAVENOUS | Status: AC | PRN
Start: 2018-09-16 — End: 2018-09-16
  Administered 2018-09-16: 11 via INTRAVENOUS

## 2018-09-16 MED ORDER — AMINOPHYLLINE 25 MG/ML IV (NUC MED)
75.0000 mg | Freq: Once | INTRAVENOUS | Status: AC
Start: 2018-09-16 — End: 2018-09-16
  Administered 2018-09-16: 75 mg via INTRAVENOUS

## 2018-09-17 LAB — MYOCARDIAL PERFUSION IMAGING
LV dias vol: 47 mL (ref 46–106)
LV sys vol: 15 mL
Peak HR: 108 {beats}/min
Rest HR: 74 {beats}/min
SDS: 3
SRS: 1
SSS: 4
TID: 0.87

## 2018-09-18 ENCOUNTER — Telehealth: Payer: Self-pay

## 2018-09-18 NOTE — Telephone Encounter (Signed)
-----   Message from Jenean Lindau, MD sent at 09/17/2018  6:44 PM EDT ----- The results of the study is unremarkable. Please inform patient. I will discuss in detail at next appointment. Cc  primary care/referring physician Jenean Lindau, MD 09/17/2018 6:44 PM

## 2018-09-18 NOTE — Telephone Encounter (Signed)
Patient called and notified of test results. 

## 2018-09-19 ENCOUNTER — Ambulatory Visit (INDEPENDENT_AMBULATORY_CARE_PROVIDER_SITE_OTHER): Payer: Medicaid Other | Admitting: Cardiology

## 2018-09-19 ENCOUNTER — Encounter: Payer: Self-pay | Admitting: Cardiology

## 2018-09-19 ENCOUNTER — Other Ambulatory Visit: Payer: Self-pay

## 2018-09-19 VITALS — BP 118/68 | HR 91 | Ht 61.5 in | Wt 174.0 lb

## 2018-09-19 DIAGNOSIS — R0989 Other specified symptoms and signs involving the circulatory and respiratory systems: Secondary | ICD-10-CM | POA: Diagnosis not present

## 2018-09-19 DIAGNOSIS — R0789 Other chest pain: Secondary | ICD-10-CM | POA: Diagnosis not present

## 2018-09-19 DIAGNOSIS — M79642 Pain in left hand: Secondary | ICD-10-CM

## 2018-09-19 DIAGNOSIS — M79641 Pain in right hand: Secondary | ICD-10-CM

## 2018-09-19 DIAGNOSIS — R030 Elevated blood-pressure reading, without diagnosis of hypertension: Secondary | ICD-10-CM

## 2018-09-19 HISTORY — DX: Pain in right hand: M79.641

## 2018-09-19 HISTORY — DX: Pain in right hand: M79.642

## 2018-09-19 NOTE — Patient Instructions (Addendum)
Medication Instructions:  Your physician recommends that you continue on your current medications as directed. Please refer to the Current Medication list given to you today. If you need a refill on your cardiac medications before your next appointment, please call your pharmacy.   Lab work [employer]  If you have labs (blood work) drawn today and your tests are completely normal, you will receive your results only by: Marland Kitchen MyChart Message (if you have MyChart) OR . A paper copy in the mail If you have any lab test that is abnormal or we need to change your treatment, we will call you to review the results.  Testing/Procedures: Your physician has requested that you have a carotid duplex. This test is an ultrasound of the carotid arteries in your neck. It looks at blood flow through these arteries that supply the brain with blood. Allow one hour for this exam. There are no restrictions or special instructions.   Follow-Up: At [facility], you and your health needs are our priority.  As part of our continuing mission to provide you with exceptional heart care, we have created designated Provider Care Teams.  These Care Teams include your primary Cardiologist (physician) and Advanced Practice Providers (APPs -  Physician Assistants and Nurse Practitioners) who all work together to provide you with the care you need, when you need it. You will need a follow up appointment in 6 months.  Please call our office 2 months in advance to schedule this appointment.   Any Other Special Instructions Will Be Listed Below   Vascular Ultrasound A vascular ultrasound is a painless test that is done to see if you have blood flow problems or clots in your blood vessels. It uses harmless sound waves to take pictures of the arteries and veins in your body. The pictures are taken by passing a device (transducer) over certain areas of your body. Tell a health care provider about:  Any allergies you have.  All  medicines you are taking, including vitamins, herbs, eye drops, creams, and over-the-counter medicines.  Any blood disorders you have.  Any surgeries you have had.  Any medical conditions you have.  Whether you are pregnant or may be pregnant. What are the risks? Generally, this is a safe procedure. There are no known risks or complications that arise from having an ultrasound. What happens before the procedure?  If the ultrasound scan involves your upper abdomen, you may be told not to eat or chew gum the morning of your exam. Follow your health care provider's instructions.  Do not smoke or use nicotine products at least 30 minutes before the exam.  During the test, a gel will be applied to your skin. What happens during the procedure?   A gel will be applied to your skin. It may feel cool.  The transducer will be placed on the area to be examined.  Pictures will be taken. They will be displayed on one or more monitors that look like small television screens. What happens after the procedure?  You can safely drive home immediately after your exam.  You may resume your normal diet and activities.  Keep all follow-up visits as told by your health care provider. This is important.  It is up to you to get your test results. Ask your health care provider, or the department that is doing the test: ? When will my results be ready? ? How will I get my results? ? What are my treatment options? ? What other  tests do I need? ? What are my next steps? Summary  A vascular ultrasound is a painless test that is done to see if you have blood flow problems or clots in your blood vessels. It uses harmless sound waves to take pictures of the arteries and veins in your body.  Generally, this is a safe procedure. There are no known risks or complications that arise from having an ultrasound.  A gel will be applied to your skin. It may feel cool. The device that takes the pictures  (transducer) will then be placed on the area to be examined.  It is up to you to get your test results. Ask your health care provider or the department that is doing the test when your results will be ready and how you will get your results. This information is not intended to replace advice given to you by your health care provider. Make sure you discuss any questions you have with your health care provider. Document Released: 03/02/2004 Document Revised: 06/10/2018 Document Reviewed: 03/28/2017 Elsevier Patient Education  2020 [employer].

## 2018-09-19 NOTE — Progress Notes (Signed)
Cardiology Office Note:    Date:  09/19/2018   ID:  Shirley Williams, DOB 1955/08/10, MRN [id number]  PCP:  Greig Right, MD  Cardiologist:  Jenean Lindau, MD   Referring MD: Greig Right, MD    ASSESSMENT:    No diagnosis found. PLAN:    In order of problems listed above:  1. Essential hypertension: Blood pressure stable.  Importance of compliance with diet and medication stressed.  Results of stress test and echocardiogram discussed with the patient at length. 2. She has bilateral soft carotid bruits and therefore we will do bilateral carotid Doppler 3. Patient will be seen in follow-up appointment in 6 months or earlier if the patient has any concerns    Medication Adjustments/Labs and Tests Ordered: Current medicines are reviewed at length with the patient today.  Concerns regarding medicines are outlined above.  No orders of the defined types were placed in this encounter.  No orders of the defined types were placed in this encounter.    No chief complaint on file.    History of Present Illness:    Shirley Williams is a 63 y.o. female.  Patient was evaluated for chest discomfort.  Her echocardiogram and stress test are unremarkable.  She denies any problems at this time and takes care of activities of daily living.  No chest pain orthopnea or PND.  Past Medical History:  Diagnosis Date  . Arthritis   . Depression   . GERD (gastroesophageal reflux disease)     Past Surgical History:  Procedure Laterality Date  . APPENDECTOMY    . LAPAROSCOPIC APPENDECTOMY  01/15/2012   Procedure: APPENDECTOMY LAPAROSCOPIC;  Surgeon: Imogene Burn. Tsuei, MD;  Location: [place];  Service: General;  Laterality: N/A;  . TONSILLECTOMY      Current Medications: Current Meds  Medication Sig  . albuterol (PROVENTIL HFA;VENTOLIN HFA) 108 (90 Base) MCG/ACT inhaler Inhale 2 puffs into the lungs every 4 (four) hours as needed for wheezing or shortness of breath.  Marland Kitchen atorvastatin (LIPITOR)  40 MG tablet Take 1 tablet (40 mg total) by mouth every evening. Fir high Cholesterol/fat  . clonazePAM (KLONOPIN) 1 MG tablet Take 1 tablet (1 mg total) by mouth daily as needed (for panic attacks). For panic attacks  . diclofenac (VOLTAREN) 75 MG EC tablet Take 1 tablet by mouth 2 (two) times a day.  . DULoxetine (CYMBALTA) 60 MG capsule Take 1 capsule by mouth daily.  Marland Kitchen esomeprazole (NEXIUM) 40 MG capsule Take 40 mg by mouth daily at 12 noon.  . fluticasone (FLONASE) 50 MCG/ACT nasal spray Place 2 sprays into both nostrils every evening. For allergies  . gabapentin (NEURONTIN) 300 MG capsule Take 1 capsule by mouth daily.  Marland Kitchen losartan (COZAAR) 25 MG tablet Take 25 mg by mouth daily.  . mirtazapine (REMERON) 15 MG tablet Take 1 tablet by mouth daily.  Marland Kitchen topiramate (TOPAMAX) 50 MG tablet Take 1 tablet (50 mg total) by mouth daily. For mood stabilization  . [DISCONTINUED] celecoxib (CELEBREX) 200 MG capsule Take 1 capsule by mouth 2 (two) times daily.  . [DISCONTINUED] traMADol (ULTRAM) 50 MG tablet Take 1-2 tablets (50-100 mg total) by mouth every 6 (six) hours as needed for moderate pain.     Allergies:   Sulfa antibiotics, Sulfacetamide sodium, and Tape   Social History   Socioeconomic History  . Marital status: Married    Spouse name: Not on file  . Number of children: Not on file  . Years of  education: Not on file  . Highest education level: Not on file  Occupational History  . Not on file  Social Needs  . Financial resource strain: Not on file  . Food insecurity    Worry: Not on file    Inability: Not on file  . Transportation needs    Medical: Not on file    Non-medical: Not on file  Tobacco Use  . Smoking status: Former Smoker    Packs/day: 1.00  . Smokeless tobacco: Never Used  Substance and Sexual Activity  . Alcohol use: Yes    Comment: occ  . Drug use: No  . Sexual activity: Yes    Birth control/protection: Post-menopausal  Lifestyle  . Physical activity     Days per week: Not on file    Minutes per session: Not on file  . Stress: Not on file  Relationships  . Social Herbalist on phone: Not on file    Gets together: Not on file    Attends religious service: Not on file    Active member of club or organization: Not on file    Attends meetings of clubs or organizations: Not on file    Relationship status: Not on file  Other Topics Concern  . Not on file  Social History Narrative  . Not on file     Family History: The patient's family history includes Asthma in her sister; COPD in her mother; Cancer in her father; Depression in her sister.  ROS:   Please see the history of present illness.    All other systems reviewed and are negative.  EKGs/Labs/Other Studies Reviewed:    The following studies were reviewed today:  Study Highlights   The left ventricular ejection fraction is hyperdynamic (>65%).  Nuclear stress EF: 68%.  There was no ST segment deviation noted during stress.  The study is normal.  This is a low risk study.      Recent Labs: No results found for requested labs within last 8760 hours.  Recent Lipid Panel    Component Value Date/Time   CHOL 223 (H) 10/05/2015 0627   TRIG 175 (H) 10/05/2015 0627   HDL 65 10/05/2015 0627   CHOLHDL 3.4 10/05/2015 0627   VLDL 35 10/05/2015 0627   LDLCALC 123 (H) 10/05/2015 0627    Physical Exam:    VS:  BP 118/68 (BP Location: Left Arm, Patient Position: Sitting, Cuff Size: Normal)   Pulse 91   Ht 5' 1.5" (1.562 m)   Wt 174 lb (78.9 kg)   SpO2 98%   BMI 32.34 kg/m     Wt Readings from Last 3 Encounters:  09/19/18 174 lb (78.9 kg)  09/16/18 167 lb (75.8 kg)  05/13/18 167 lb (75.8 kg)     GEN: Patient is in no acute distress HEENT: Normal NECK: No JVD; bilateral soft carotid bruits LYMPHATICS: No lymphadenopathy CARDIAC: Hear sounds regular, 2/6 systolic murmur at the apex. RESPIRATORY:  Clear to auscultation without rales, wheezing or rhonchi   ABDOMEN: Soft, non-tender, non-distended MUSCULOSKELETAL:  No edema; No deformity  SKIN: Warm and dry NEUROLOGIC:  Alert and oriented x 3 PSYCHIATRIC:  Normal affect   Signed, Jenean Lindau, MD  09/19/2018 10:04 AM    [place]

## 2018-09-19 NOTE — Addendum Note (Signed)
Addended by: Polly Cobia A on: 09/19/2018 10:37 AM   Modules accepted: Orders

## 2018-09-25 ENCOUNTER — Ambulatory Visit (HOSPITAL_BASED_OUTPATIENT_CLINIC_OR_DEPARTMENT_OTHER)
Admission: RE | Admit: 2018-09-25 | Discharge: 2018-09-25 | Disposition: A | Discharge status: Home / Self Care | Payer: Medicaid Other | Source: Ambulatory Visit | Attending: Cardiology | Admitting: Cardiology

## 2018-09-25 ENCOUNTER — Other Ambulatory Visit: Payer: Self-pay

## 2018-09-25 DIAGNOSIS — R030 Elevated blood-pressure reading, without diagnosis of hypertension: Secondary | ICD-10-CM | POA: Diagnosis not present

## 2018-09-25 DIAGNOSIS — R0789 Other chest pain: Secondary | ICD-10-CM

## 2018-09-25 DIAGNOSIS — R0989 Other specified symptoms and signs involving the circulatory and respiratory systems: Secondary | ICD-10-CM | POA: Diagnosis not present

## 2018-09-25 NOTE — Progress Notes (Addendum)
Bilateral carotid doppler performed   09/25/18 Cardell Peach RDCS, RVT

## 2018-09-29 ENCOUNTER — Emergency Department (HOSPITAL_COMMUNITY): Payer: Medicaid Other

## 2018-09-29 ENCOUNTER — Telehealth: Payer: Self-pay

## 2018-09-29 ENCOUNTER — Emergency Department (HOSPITAL_COMMUNITY)
Admission: EM | Admit: 2018-09-29 | Discharge: 2018-09-30 | Disposition: A | Discharge status: Home / Self Care | Payer: Medicaid Other | Attending: Emergency Medicine | Admitting: Emergency Medicine

## 2018-09-29 ENCOUNTER — Other Ambulatory Visit: Payer: Self-pay

## 2018-09-29 DIAGNOSIS — J449 Chronic obstructive pulmonary disease, unspecified: Secondary | ICD-10-CM | POA: Diagnosis not present

## 2018-09-29 DIAGNOSIS — Y939 Activity, unspecified: Secondary | ICD-10-CM | POA: Insufficient documentation

## 2018-09-29 DIAGNOSIS — Z87891 Personal history of nicotine dependence: Secondary | ICD-10-CM | POA: Insufficient documentation

## 2018-09-29 DIAGNOSIS — Y929 Unspecified place or not applicable: Secondary | ICD-10-CM | POA: Insufficient documentation

## 2018-09-29 DIAGNOSIS — S51819A Laceration without foreign body of unspecified forearm, initial encounter: Secondary | ICD-10-CM

## 2018-09-29 DIAGNOSIS — M545 Low back pain: Secondary | ICD-10-CM | POA: Insufficient documentation

## 2018-09-29 DIAGNOSIS — S61512A Laceration without foreign body of left wrist, initial encounter: Secondary | ICD-10-CM | POA: Diagnosis present

## 2018-09-29 DIAGNOSIS — I10 Essential (primary) hypertension: Secondary | ICD-10-CM | POA: Insufficient documentation

## 2018-09-29 DIAGNOSIS — J45909 Unspecified asthma, uncomplicated: Secondary | ICD-10-CM | POA: Diagnosis not present

## 2018-09-29 DIAGNOSIS — M25552 Pain in left hip: Secondary | ICD-10-CM | POA: Diagnosis not present

## 2018-09-29 DIAGNOSIS — W51XXXA Accidental striking against or bumped into by another person, initial encounter: Secondary | ICD-10-CM | POA: Diagnosis not present

## 2018-09-29 DIAGNOSIS — W19XXXA Unspecified fall, initial encounter: Secondary | ICD-10-CM

## 2018-09-29 DIAGNOSIS — S61511A Laceration without foreign body of right wrist, initial encounter: Secondary | ICD-10-CM | POA: Insufficient documentation

## 2018-09-29 DIAGNOSIS — Y999 Unspecified external cause status: Secondary | ICD-10-CM | POA: Diagnosis not present

## 2018-09-29 NOTE — ED Triage Notes (Signed)
Pt states she was assisting her husband to the car and he passed out causing them to both fall. She states she has pain all down her left leg and pain to her left wrist.

## 2018-09-29 NOTE — Telephone Encounter (Signed)
Information relayed, patient would like to review in office visit with Dr. Docia Furl. Scheduled for f/u 10/07/18. Copy sent to Dr. Burnett Sheng per Dr. Docia Furl.

## 2018-09-29 NOTE — Telephone Encounter (Signed)
-----   Message from Jenean Lindau, MD sent at 09/25/2018  1:20 PM EDT ----- The results of the study is unremarkable. Please inform patient. I will discuss in detail at next appointment. Cc  primary care/referring physician Jenean Lindau, MD 09/25/2018 1:20 PM

## 2018-09-30 MED ORDER — BACITRACIN ZINC 500 UNIT/GM EX OINT: TOPICAL_OINTMENT | Freq: Two times a day (BID) | CUTANEOUS | Status: DC

## 2018-09-30 MED ORDER — IBUPROFEN 800 MG PO TABS: 800.0000 mg | ORAL_TABLET | Freq: Three times a day (TID) | ORAL | 0 refills | Status: DC

## 2018-09-30 MED ORDER — OXYCODONE-ACETAMINOPHEN 5-325 MG PO TABS
2.0000 | ORAL_TABLET | Freq: Once | ORAL | Status: AC
  Administered 2018-09-30: 2 via ORAL
  Filled 2018-09-30: qty 2

## 2018-09-30 MED ORDER — KETOROLAC TROMETHAMINE 60 MG/2ML IM SOLN
30.0000 mg | Freq: Once | INTRAMUSCULAR | Status: AC
  Administered 2018-09-30: 30 mg via INTRAMUSCULAR
  Filled 2018-09-30: qty 2

## 2018-09-30 NOTE — ED Notes (Signed)
Pt has several skin tears to the elbows and wrist.  No other issues.

## 2018-09-30 NOTE — ED Notes (Signed)
Patient called 2x with no answer.  Pt not physically present, checked every patient name in waiting area prior to moving off floor.

## 2018-09-30 NOTE — ED Provider Notes (Signed)
Emergency Department Provider Note   I have reviewed the triage vital signs and the nursing notes.   HISTORY  Chief Complaint Fall   HPI Shirley Williams is a 63 y.o. female who suffered a fall earlier today when her husband syncopized injuring her left wrist and right wrist (both skin tears) and lower back/tailbone. She has been waiting for multiple hours and now she is having pain in her right posterior leg and upper back as well. Did not head. No syncope. No other associated symptoms.    No other associated or modifying symptoms.    Past Medical History:  Diagnosis Date  . Arthritis   . Depression   . GERD (gastroesophageal reflux disease)     Patient Active Problem List   Diagnosis Date Noted  . Anemia 05/13/2018  . Anxiety 05/13/2018  . Arthritis 05/13/2018  . Asthma 05/13/2018  . COPD (chronic obstructive pulmonary disease) ([place]) 05/13/2018  . Depression 05/13/2018  . Hiatal hernia 05/13/2018  . Hyperlipidemia 05/13/2018  . Migraines 05/13/2018  . Hearing loss, mixed, bilateral 05/13/2018  . Tinnitus, left 05/13/2018  . Essential hypertension 05/13/2018  . Chest discomfort 05/13/2018  . Elevated blood-pressure reading, without diagnosis of hypertension 10/07/2017  . Body mass index (bmi) 31.0-31.9, adult 03/12/2017  . Internal derangement of left knee 09/17/2016  . Status post total right knee replacement 07/23/2016  . Major depressive disorder, recurrent episode, in partial remission ([place]) 07/03/2016  . Panic disorder 07/03/2016  . Personality disorder ([place]) 07/03/2016  . Atelectasis of left middle ear 04/03/2016  . Chronic Eustachian tube dysfunction, bilateral 12/07/2015  . Retraction pocket of tympanic membrane of left ear 12/07/2015  . Chronic pain syndrome 10/04/2015  . MDD (major depressive disorder), recurrent, severe, with psychosis ([place]) 10/03/2015  . Aftercare following right knee joint replacement surgery 07/26/2015  . Risk for falls 06/20/2015   . Acute medial meniscus tear of left knee 05/19/2015  . Lumbar radiculopathy 05/19/2015  . Tear of lateral meniscus of right knee, current 05/19/2015  . [facility] DJD(carpometacarpal degenerative joint disease), localized primary 03/22/2015  . Knee effusion, right 03/22/2015  . Primary osteoarthritis of both knees 01/23/2015  . Primary osteoarthritis of both first carpometacarpal joints 12/13/2014  . Aftercare following surgery of the musculoskeletal system 07/20/2014  . Amputation of third finger of left hand 07/20/2014  . Acute appendicitis 01/15/2012    Past Surgical History:  Procedure Laterality Date  . APPENDECTOMY    . LAPAROSCOPIC APPENDECTOMY  01/15/2012   Procedure: APPENDECTOMY LAPAROSCOPIC;  Surgeon: Imogene Burn. Georgette Dover, MD;  Location: [place];  Service: General;  Laterality: N/A;  . TONSILLECTOMY      Current Outpatient Rx  . Order #: [id number] Class: No Print  . Order #: [id number] Class: No Print  . Order #: [id number] Class: Print  . Order #: [id number] Class: Historical Med  . Order #: [id number] Class: Historical Med  . Order #: [id number] Class: Historical Med  . Order #: [id number] Class: No Print  . Order #: [id number] Class: Historical Med  . Order #: [id number] Class: Normal  . Order #: [id number] Class: Historical Med  . Order #: [id number] Class: Historical Med  . Order #: [id number] Class: Normal    Allergies Sulfa antibiotics, Sulfacetamide sodium, and Tape  Family History  Problem Relation Age of Onset  . COPD Mother   . Cancer Father        brain  . Asthma Sister   . Depression Sister     Social History Social History   Tobacco Use  . Smoking  status: Former Smoker    Packs/day: 1.00  . Smokeless tobacco: Never Used  Substance Use Topics  . Alcohol use: Yes    Comment: occ  . Drug use: No    Review of Systems  All other systems negative except as documented in the HPI. All pertinent positives and negatives as reviewed in the HPI.  ____________________________________________   PHYSICAL EXAM:  VITAL SIGNS: ED Triage Vitals  Enc Vitals Group     BP 09/29/18 2207 125/90     Pulse Rate 09/29/18 2207 98     Resp 09/29/18 2207 14     Temp 09/29/18 2207 98.1 F (36.7 C)     Temp Source 09/29/18 2207 Oral     SpO2 09/29/18 2207 98 %     Weight 09/29/18 2208 174 lb (78.9 kg)     Height 09/29/18 2208 5\' 2"  (1.575 m)     Head Circumference --      Peak Flow --      Pain Score 09/29/18 2207 8     Pain Loc --      Pain Edu? --      Excl. in [place]? --     Constitutional: Alert and oriented. Well appearing and in no acute distress. Eyes: Conjunctivae are normal. PERRL. EOMI. Head: Atraumatic. Nose: No congestion/rhinnorhea. Mouth/Throat: Mucous membranes are moist.  Oropharynx non-erythematous. Neck: No stridor.  No meningeal signs.   Cardiovascular: Normal rate, regular rhythm. Good peripheral circulation. Grossly normal heart sounds.   Respiratory: Normal respiratory effort.  No retractions. Lungs CTAB. Gastrointestinal: Soft and nontender. No distention.  Musculoskeletal: No lower extremity tenderness nor edema. No gross deformities of extremities. ttp to upper posterior pelvis. No lumbar stepoffs/deformity or midline pain. Some ttp to left wrist but normal ROM/strength/sensation. Neurologic:  Normal speech and language. No gross focal neurologic deficits are appreciated.  Skin:  Skin is warm, dry with skin tear to left lateral/posterior wrist and right wrist as well.  No rash noted.   ____________________________________________   RADIOLOGY  Dg Wrist Complete Left  Result Date: 09/29/2018 CLINICAL DATA:  Fall EXAM: LEFT WRIST - COMPLETE 3+ VIEW COMPARISON:  None. FINDINGS: No fracture or dislocation is seen. Degenerative changes of the 1st carpometacarpal joint. The visualized soft tissues are unremarkable. IMPRESSION: Negative. Electronically Signed   By: Julian Hy M.D.   On: 09/29/2018 23:14   Dg  Hip Unilat With Pelvis 2-3 Views Left  Result Date: 09/29/2018 CLINICAL DATA:  Fall EXAM: DG HIP (WITH OR WITHOUT PELVIS) 2-3V LEFT COMPARISON:  None. FINDINGS: No fracture or dislocation is seen. Bilateral hip joint spaces are preserved. Visualized bony pelvis appears intact. IMPRESSION: No fracture or dislocation is seen. Electronically Signed   By: Julian Hy M.D.   On: 09/29/2018 23:14    ____________________________________________   PROCEDURES  Procedure(s) performed:   Procedures   ____________________________________________   INITIAL IMPRESSION / ASSESSMENT AND PLAN / ED COURSE  Workup as above. Skin care providced. No broken bones. No e/o sciatica. No indication for further imaging. Pain treated here. Nsaids/RICE at home.      Pertinent labs & imaging results that were available during my care of the patient were reviewed by me and considered in my medical decision making (see chart for details).  A medical screening exam was performed and I feel the patient has had an appropriate workup for their chief complaint at this time and likelihood of emergent condition existing is low. They have been counseled on decision, discharge,  follow up and which symptoms necessitate immediate return to the emergency department. They or their family verbally stated understanding and agreement with plan and discharged in stable condition.   ____________________________________________  FINAL CLINICAL IMPRESSION(S) / ED DIAGNOSES  Final diagnoses:  Fall, initial encounter  Skin tear of forearm without complication, unspecified laterality, initial encounter     MEDICATIONS GIVEN DURING THIS VISIT:  Medications  ketorolac (TORADOL) injection 30 mg (has no administration in time range)  bacitracin ointment (has no administration in time range)  oxyCODONE-acetaminophen (PERCOCET/ROXICET) 5-325 MG per tablet 2 tablet (has no administration in time range)     NEW OUTPATIENT  MEDICATIONS STARTED DURING THIS VISIT:  New Prescriptions   IBUPROFEN (ADVIL) 800 MG TABLET    Take 1 tablet (800 mg total) by mouth 3 (three) times daily.    Note:  This note was prepared with assistance of Dragon voice recognition software. Occasional wrong-word or sound-a-like substitutions may have occurred due to the inherent limitations of voice recognition software.   Kadence Mimbs, Corene Cornea, MD 09/30/18 [phone]

## 2018-10-07 ENCOUNTER — Other Ambulatory Visit: Payer: Self-pay

## 2018-10-07 ENCOUNTER — Ambulatory Visit (INDEPENDENT_AMBULATORY_CARE_PROVIDER_SITE_OTHER): Payer: Medicaid Other | Admitting: Cardiology

## 2018-10-07 ENCOUNTER — Encounter: Payer: Self-pay | Admitting: Cardiology

## 2018-10-07 ENCOUNTER — Telehealth: Payer: Self-pay | Admitting: Radiology

## 2018-10-07 VITALS — BP 112/70 | HR 95 | Ht 62.0 in | Wt 181.1 lb

## 2018-10-07 DIAGNOSIS — R002 Palpitations: Secondary | ICD-10-CM | POA: Diagnosis not present

## 2018-10-07 DIAGNOSIS — E782 Mixed hyperlipidemia: Secondary | ICD-10-CM

## 2018-10-07 NOTE — Progress Notes (Signed)
Cardiology Office Note:    Date:  10/07/2018   ID:  Shirley Williams, DOB [birth year], MRN [id number]  PCP:  Greig Right, MD  Cardiologist:  Jenean Lindau, MD   Referring MD: Greig Right, MD    ASSESSMENT:    1. Palpitations   2. Mixed hyperlipidemia    PLAN:    In order of problems listed above:  1. Palpitations: With her my findings.  She occasionally feels dizzy.  We will do a 2-week ZIO monitor. 2. We evaluated her for chest discomfort and her Lexiscan sestamibi was unremarkable also her carotid evaluation was with her. 3. Essential hypertension: Blood pressure is  stable 4. Mixed dyslipidemia: Diet was discussed.  Lipids are followed by primary care physician. 5. Patient will be seen in follow-up appointment in 6 months or earlier if the patient has any concerns    Medication Adjustments/Labs and Tests Ordered: Current medicines are reviewed at length with the patient today.  Concerns regarding medicines are outlined above.  No orders of the defined types were placed in this encounter.  No orders of the defined types were placed in this encounter.    No chief complaint on file.    History of Present Illness:    Shirley Williams is a 63 y.o. female.  Patient has past medical history of essential hypertension she mentions to me that she occasionally has dizziness.  Her tests have come out unremarkable.  She denies any chest pain orthopnea or PND now.  At the time of my evaluation, the patient is alert awake oriented and in no distress.  She is very stressed about her domestic situation health of family member.  Past Medical History:  Diagnosis Date  . Arthritis   . Depression   . GERD (gastroesophageal reflux disease)     Past Surgical History:  Procedure Laterality Date  . APPENDECTOMY    . LAPAROSCOPIC APPENDECTOMY  01/15/2012   Procedure: APPENDECTOMY LAPAROSCOPIC;  Surgeon: Imogene Burn. Tsuei, MD;  Location: [place];  Service: General;  Laterality: N/A;  .  TONSILLECTOMY      Current Medications: Current Meds  Medication Sig  . albuterol (PROVENTIL HFA;VENTOLIN HFA) 108 (90 Base) MCG/ACT inhaler Inhale 2 puffs into the lungs every 4 (four) hours as needed for wheezing or shortness of breath.  . ALPRAZolam (XANAX) 0.5 MG tablet Take 1 tablet by mouth 2 (two) times a day.  Marland Kitchen atorvastatin (LIPITOR) 40 MG tablet Take 1 tablet (40 mg total) by mouth every evening. Fir high Cholesterol/fat  . clonazePAM (KLONOPIN) 1 MG tablet Take 1 tablet (1 mg total) by mouth daily as needed (for panic attacks). For panic attacks  . diclofenac (CATAFLAM) 50 MG tablet Take 1 tablet by mouth daily.  . DULoxetine (CYMBALTA) 60 MG capsule Take 1 capsule by mouth daily.  Marland Kitchen esomeprazole (NEXIUM) 40 MG capsule Take 40 mg by mouth daily at 12 noon.  . fluticasone (FLONASE) 50 MCG/ACT nasal spray Place 2 sprays into both nostrils every evening. For allergies  . gabapentin (NEURONTIN) 300 MG capsule Take 1 capsule by mouth daily.  Marland Kitchen ibuprofen (ADVIL) 800 MG tablet Take 1 tablet (800 mg total) by mouth 3 (three) times daily.  Marland Kitchen losartan (COZAAR) 25 MG tablet Take 25 mg by mouth daily.  . mirtazapine (REMERON) 15 MG tablet Take 1 tablet by mouth daily.  Marland Kitchen topiramate (TOPAMAX) 50 MG tablet Take 1 tablet (50 mg total) by mouth daily. For mood stabilization     Allergies:  Sulfa antibiotics, Sulfacetamide sodium, and Tape   Social History   Socioeconomic History  . Marital status: Married    Spouse name: Not on file  . Number of children: Not on file  . Years of education: Not on file  . Highest education level: Not on file  Occupational History  . Not on file  Social Needs  . Financial resource strain: Not on file  . Food insecurity    Worry: Not on file    Inability: Not on file  . Transportation needs    Medical: Not on file    Non-medical: Not on file  Tobacco Use  . Smoking status: Former Smoker    Packs/day: 1.00  . Smokeless tobacco: Never Used   Substance and Sexual Activity  . Alcohol use: Yes    Comment: occ  . Drug use: No  . Sexual activity: Yes    Birth control/protection: Post-menopausal  Lifestyle  . Physical activity    Days per week: Not on file    Minutes per session: Not on file  . Stress: Not on file  Relationships  . Social Herbalist on phone: Not on file    Gets together: Not on file    Attends religious service: Not on file    Active member of club or organization: Not on file    Attends meetings of clubs or organizations: Not on file    Relationship status: Not on file  Other Topics Concern  . Not on file  Social History Narrative  . Not on file     Family History: The patient's family history includes Asthma in her sister; COPD in her mother; Cancer in her father; Depression in her sister.  ROS:   Please see the history of present illness.    All other systems reviewed and are negative.  EKGs/Labs/Other Studies Reviewed:    The following stuSummary: Right Carotid: There was no evidence of thrombus, dissection, atherosclerotic                plaque or stenosis in the cervical carotid system.  Left Carotid: There was no evidence of thrombus, dissection, or stenosis in the               cervical carotid system.  Vertebrals:  Bilateral vertebral arteries demonstrate antegrade flow. Subclavians: Normal flow hemodynamics were seen in bilateral subclavian              arteries. dies were reviewed today:   Recent Labs: No results found for requested labs within last 8760 hours.  Recent Lipid Panel    Component Value Date/Time   CHOL 223 (H) 10/05/2015 0627   TRIG 175 (H) 10/05/2015 0627   HDL 65 10/05/2015 0627   CHOLHDL 3.4 10/05/2015 0627   VLDL 35 10/05/2015 0627   LDLCALC 123 (H) 10/05/2015 0627    Physical Exam:    VS:  BP 112/70   Pulse 95   Ht 5\' 2"  (1.575 m)   Wt 181 lb 1.9 oz (82.2 kg)   LMP 12/04/2011   SpO2 99%   BMI 33.13 kg/m     Wt Readings from Last  3 Encounters:  10/07/18 181 lb 1.9 oz (82.2 kg)  09/29/18 174 lb (78.9 kg)  09/19/18 174 lb (78.9 kg)     GEN: Patient is in no acute distress HEENT: Normal NECK: No JVD; No carotid bruits LYMPHATICS: No lymphadenopathy CARDIAC: Hear sounds regular, 2/6 systolic murmur at the apex. RESPIRATORY:  Clear to auscultation without rales, wheezing or rhonchi  ABDOMEN: Soft, non-tender, non-distended MUSCULOSKELETAL:  No edema; No deformity  SKIN: Warm and dry NEUROLOGIC:  Alert and oriented x 3 PSYCHIATRIC:  Normal affect   Signed, Jenean Lindau, MD  10/07/2018 9:24 AM    [place]

## 2018-10-07 NOTE — Patient Instructions (Signed)
Medication Instructions:  Your physician recommends that you continue on your current medications as directed. Please refer to the Current Medication list given to you today.  If you need a refill on your cardiac medications before your next appointment, please call your pharmacy.   Lab work: NONE If you have labs (blood work) drawn today and your tests are completely normal, you will receive your results only by: Marland Kitchen MyChart Message (if you have MyChart) OR . A paper copy in the mail If you have any lab test that is abnormal or we need to change your treatment, we will call you to review the results.  Testing/Procedures: 2 WEEK ZIO MONITOR  Follow-Up: At [facility], you and your health needs are our priority.  As part of our continuing mission to provide you with exceptional heart care, we have created designated Provider Care Teams.  These Care Teams include your primary Cardiologist (physician) and Advanced Practice Providers (APPs -  Physician Assistants and Nurse Practitioners) who all work together to provide you with the care you need, when you need it. You will need a follow up appointment in 6 months.  Please call our office 2 months in advance to schedule this appointment. Dr Geraldo Pitter  Any Other Special Instructions Will Be Listed Below (If Applicable).

## 2018-10-07 NOTE — Telephone Encounter (Signed)
Enrolled patient for a 14 day Telemetry Zio monitor to be mailed. Brief instructions were gone over with patient and she knows to expect the monitor to arrive in 3-4 days.

## 2018-10-07 NOTE — Addendum Note (Signed)
Addended by: Polly Cobia A on: 10/07/2018 09:39 AM   Modules accepted: Orders

## 2018-10-12 ENCOUNTER — Other Ambulatory Visit: Payer: Self-pay

## 2018-10-12 ENCOUNTER — Emergency Department (HOSPITAL_COMMUNITY)
Admission: EM | Admit: 2018-10-12 | Discharge: 2018-10-13 | Disposition: A | Discharge status: Home / Self Care | Payer: Medicaid Other | Attending: Emergency Medicine | Admitting: Emergency Medicine

## 2018-10-12 ENCOUNTER — Other Ambulatory Visit (INDEPENDENT_AMBULATORY_CARE_PROVIDER_SITE_OTHER): Payer: Medicaid Other

## 2018-10-12 DIAGNOSIS — I10 Essential (primary) hypertension: Secondary | ICD-10-CM | POA: Diagnosis not present

## 2018-10-12 DIAGNOSIS — R102 Pelvic and perineal pain: Secondary | ICD-10-CM | POA: Diagnosis present

## 2018-10-12 DIAGNOSIS — M8448XA Pathological fracture, other site, initial encounter for fracture: Secondary | ICD-10-CM | POA: Diagnosis not present

## 2018-10-12 DIAGNOSIS — J449 Chronic obstructive pulmonary disease, unspecified: Secondary | ICD-10-CM | POA: Insufficient documentation

## 2018-10-12 DIAGNOSIS — Z87891 Personal history of nicotine dependence: Secondary | ICD-10-CM | POA: Insufficient documentation

## 2018-10-12 DIAGNOSIS — E782 Mixed hyperlipidemia: Secondary | ICD-10-CM

## 2018-10-12 DIAGNOSIS — Y929 Unspecified place or not applicable: Secondary | ICD-10-CM | POA: Insufficient documentation

## 2018-10-12 DIAGNOSIS — Z79899 Other long term (current) drug therapy: Secondary | ICD-10-CM | POA: Insufficient documentation

## 2018-10-12 DIAGNOSIS — S32501A Unspecified fracture of right pubis, initial encounter for closed fracture: Secondary | ICD-10-CM | POA: Diagnosis not present

## 2018-10-12 DIAGNOSIS — N39498 Other specified urinary incontinence: Secondary | ICD-10-CM | POA: Diagnosis not present

## 2018-10-12 DIAGNOSIS — X58XXXA Exposure to other specified factors, initial encounter: Secondary | ICD-10-CM | POA: Diagnosis not present

## 2018-10-12 DIAGNOSIS — Y939 Activity, unspecified: Secondary | ICD-10-CM | POA: Insufficient documentation

## 2018-10-12 DIAGNOSIS — Y999 Unspecified external cause status: Secondary | ICD-10-CM | POA: Insufficient documentation

## 2018-10-12 DIAGNOSIS — R002 Palpitations: Secondary | ICD-10-CM

## 2018-10-12 NOTE — ED Triage Notes (Signed)
Pt c/o pelvic pain. States that her husband fell on her three weeks ago and has gotten progressively worse.

## 2018-10-13 ENCOUNTER — Emergency Department (HOSPITAL_COMMUNITY): Payer: Medicaid Other

## 2018-10-13 MED ORDER — HYDROCODONE-ACETAMINOPHEN 5-325 MG PO TABS: 1.0000 | ORAL_TABLET | Freq: Four times a day (QID) | ORAL | 0 refills | Status: AC | PRN

## 2018-10-13 MED ORDER — HYDROCODONE-ACETAMINOPHEN 5-325 MG PO TABS
2.0000 | ORAL_TABLET | Freq: Once | ORAL | Status: AC
  Administered 2018-10-13: 04:00:00 2 via ORAL
  Filled 2018-10-13: qty 2

## 2018-10-13 NOTE — ED Notes (Signed)
Family at bedside. 

## 2018-10-13 NOTE — Discharge Instructions (Signed)
Continue with your home prescriptions of diclofenac and Flexeril for management of pain.  You have been prescribed a short course of Vicodin to use for additional pain control.  Do not drive or drink alcohol after taking this medication as it may make you drowsy and impair your judgment.  Use a rolling walker for stability to prevent further falls.  Call your orthopedic doctor in the morning to schedule close office follow-up.  You may return to the ED for any new or concerning symptoms.

## 2018-10-13 NOTE — ED Provider Notes (Signed)
[facility] EMERGENCY DEPARTMENT Provider Note   CSN: [id number] Arrival date & time: 10/12/18  2153    History   Chief Complaint Chief Complaint  Patient presents with  . Pelvic Pain    HPI Shirley Williams is a 63 y.o. female.     63 y.o. female with history of arthritis, depression, esophageal reflux presents to the emergency department for evaluation of right groin pain.  She states that her pain has been constant since her husband fell on her 3 weeks ago.  She was actually evaluated in the emergency department at the time of the incident.  Complained of injuring her left and right wrist (both skin tears) and lower back/tailbone.  Underwent imaging in the emergency department which was reassuring.  She has been taking ibuprofen and Flexeril without improvement to her pain.  Does have some urgency incontinence, stating that she has urinated on herself when she is unable to walk to the bathroom in time secondary to pain.  Denies any bowel incontinence, genital or perianal numbness, problems defecating, dysuria, repeat trauma or injury.   Pelvic Pain    Past Medical History:  Diagnosis Date  . Arthritis   . Depression   . GERD (gastroesophageal reflux disease)     Patient Active Problem List   Diagnosis Date Noted  . Pain in both hands 09/19/2018  . Bilateral carpal tunnel syndrome 07/31/2018  . Anemia 05/13/2018  . Anxiety 05/13/2018  . Arthritis 05/13/2018  . Asthma 05/13/2018  . COPD (chronic obstructive pulmonary disease) ([place]) 05/13/2018  . Depression 05/13/2018  . Hiatal hernia 05/13/2018  . Hyperlipidemia 05/13/2018  . Migraines 05/13/2018  . Hearing loss, mixed, bilateral 05/13/2018  . Tinnitus, left 05/13/2018  . Essential hypertension 05/13/2018  . Chest discomfort 05/13/2018  . Elevated blood-pressure reading, without diagnosis of hypertension 10/07/2017  . Body mass index (bmi) 31.0-31.9, adult 03/12/2017  . Internal derangement of left  knee 09/17/2016  . Status post total right knee replacement 07/23/2016  . Major depressive disorder, recurrent episode, in partial remission ([place]) 07/03/2016  . Panic disorder 07/03/2016  . Personality disorder ([place]) 07/03/2016  . Atelectasis of left middle ear 04/03/2016  . Chronic Eustachian tube dysfunction, bilateral 12/07/2015  . Retraction pocket of tympanic membrane of left ear 12/07/2015  . Chronic pain syndrome 10/04/2015  . MDD (major depressive disorder), recurrent, severe, with psychosis ([place]) 10/03/2015  . Aftercare following right knee joint replacement surgery 07/26/2015  . Risk for falls 06/20/2015  . Acute medial meniscus tear of left knee 05/19/2015  . Lumbar radiculopathy 05/19/2015  . Tear of lateral meniscus of right knee, current 05/19/2015  . [facility] DJD(carpometacarpal degenerative joint disease), localized primary 03/22/2015  . Knee effusion, right 03/22/2015  . Primary osteoarthritis of both knees 01/23/2015  . Primary osteoarthritis of both first carpometacarpal joints 12/13/2014  . Aftercare following surgery of the musculoskeletal system 07/20/2014  . Amputation of third finger of left hand 07/20/2014  . Acute appendicitis 01/15/2012    Past Surgical History:  Procedure Laterality Date  . APPENDECTOMY    . LAPAROSCOPIC APPENDECTOMY  01/15/2012   Procedure: APPENDECTOMY LAPAROSCOPIC;  Surgeon: Imogene Burn. Georgette Dover, MD;  Location: [facility];  Service: General;  Laterality: N/A;  . TONSILLECTOMY       OB History   No obstetric history on file.      Home Medications    Prior to Admission medications   Medication Sig Start Date End Date Taking? Authorizing Provider  albuterol (PROVENTIL  HFA;VENTOLIN HFA) 108 (90 Base) MCG/ACT inhaler Inhale 2 puffs into the lungs every 4 (four) hours as needed for wheezing or shortness of breath. 10/07/15   Lindell Spar I, NP  ALPRAZolam Duanne Moron) 0.5 MG tablet Take 1 tablet by mouth 2 (two) times a day. 09/24/18   [provider]  atorvastatin (LIPITOR) 40 MG tablet Take 1 tablet (40 mg total) by mouth every evening. Mayer Masker high Cholesterol/fat 10/07/15   Lindell Spar I, NP  clonazePAM (KLONOPIN) 1 MG tablet Take 1 tablet (1 mg total) by mouth daily as needed (for panic attacks). For panic attacks 10/07/15   Lindell Spar I, NP  diclofenac (CATAFLAM) 50 MG tablet Take 1 tablet by mouth daily.    [provider]  DULoxetine (CYMBALTA) 60 MG capsule Take 1 capsule by mouth daily. 04/02/18   [provider]  esomeprazole (NEXIUM) 40 MG capsule Take 40 mg by mouth daily at 12 noon.    [provider]  fluticasone (FLONASE) 50 MCG/ACT nasal spray Place 2 sprays into both nostrils every evening. For allergies 10/07/15   Lindell Spar I, NP  gabapentin (NEURONTIN) 300 MG capsule Take 1 capsule by mouth daily. 09/16/18   [provider]  HYDROcodone-acetaminophen (NORCO/VICODIN) 5-325 MG tablet Take 1-2 tablets by mouth every 6 (six) hours as needed for up to 3 days for moderate pain or severe pain. 10/13/18 10/16/18  Antonietta Breach, PA-C  ibuprofen (ADVIL) 800 MG tablet Take 1 tablet (800 mg total) by mouth 3 (three) times daily. 09/30/18   Mesner, Corene Cornea, MD  losartan (COZAAR) 25 MG tablet Take 25 mg by mouth daily.    [provider]  mirtazapine (REMERON) 15 MG tablet Take 1 tablet by mouth daily. 04/02/18   [provider]  topiramate (TOPAMAX) 50 MG tablet Take 1 tablet (50 mg total) by mouth daily. For mood stabilization 10/07/15   Encarnacion Slates, NP    Family History Family History  Problem Relation Age of Onset  . COPD Mother   . Cancer Father        brain  . Asthma Sister   . Depression Sister     Social History Social History   Tobacco Use  . Smoking status: Former Smoker    Packs/day: 1.00  . Smokeless tobacco: Never Used  Substance Use Topics  . Alcohol use: Yes    Comment: occ  . Drug use: No     Allergies   Sulfa antibiotics, Sulfacetamide sodium,  and Tape   Review of Systems Review of Systems  Genitourinary: Positive for pelvic pain.  Ten systems reviewed and are negative for acute change, except as noted in the HPI.     Physical Exam Updated Vital Signs BP (!) 144/77   Pulse 78   Temp 98.7 F (37.1 C) (Oral)   Resp (!) 25   LMP 12/04/2011   SpO2 97%   Physical Exam Vitals signs and nursing note reviewed.  Constitutional:      General: She is not in acute distress.    Appearance: She is well-developed. She is not diaphoretic.     Comments: Patient in NAD  HENT:     Head: Normocephalic and atraumatic.  Eyes:     General: No scleral icterus.    Conjunctiva/sclera: Conjunctivae normal.  Neck:     Musculoskeletal: Normal range of motion.  Cardiovascular:     Rate and Rhythm: Normal rate and regular rhythm.     Pulses: Normal pulses.  Pulmonary:  Effort: Pulmonary effort is normal. No respiratory distress.  Abdominal:       Comments: Abdomen is soft, obese, nontender. No palpable masses.  Musculoskeletal: Normal range of motion.     Comments: TTP to the right pelvis without crepitus or deformity. Pain elicited with right hip flexion. No leg shortening or malrotation.  Skin:    General: Skin is warm and dry.     Coloration: Skin is not pale.     Findings: No erythema or rash.  Neurological:     Mental Status: She is alert and oriented to person, place, and time.     Coordination: Coordination normal.  Psychiatric:        Behavior: Behavior normal.      ED Treatments / Results  Labs (all labs ordered are listed, but only abnormal results are displayed) Labs Reviewed - No data to display  EKG None  Radiology Ct Pelvis Wo Contrast  Result Date: 10/13/2018 CLINICAL DATA:  Pelvic pain. Worsening pain since trauma 3 weeks ago. EXAM: CT PELVIS WITHOUT CONTRAST TECHNIQUE: Multidetector CT imaging of the pelvis was performed following the standard protocol without intravenous contrast. COMPARISON:   None. FINDINGS: Nondisplaced right pubic body fracture. On coronal reformats there is some periosteal reaction noted superiorly, compatible subacute timing by history. The fracture is definitely unhealed however with vacuum phenomenon within the fracture lucency. There is adjacent thickening of medial compartment musculature with internal gas bubbles. Bilateral nondisplaced sacral ala fractures without foraminal narrowing. Chronic bilateral L5 pars defects with advanced L5-S1 disc narrowing and anterolisthesis. There is intervertebral ankylosis at L5-S1. No acute intra-abdominal finding.  Hysterectomy. IMPRESSION: 1. Acute or subacute bilateral sacral insufficiency fracture that is nondisplaced. 2. Subacute, nondisplaced right pubic body fracture. 3. Right medial compartment muscular swelling compatible with strain in this setting. Small volume gas within the right abductor compartment is unusual in the setting of trauma but is presumably related to vacuum phenomenon within the pubic body fracture. Please ensure no infectious symptoms. 4. L5 chronic pars defects with L5-S1 anterolisthesis and advanced disc degeneration leading to ankylosis. Electronically Signed   By: Monte Fantasia M.D.   On: 10/13/2018 05:27   Dg Hip Unilat W Or Wo Pelvis 2-3 Views Right  Result Date: 10/13/2018 CLINICAL DATA:  Pelvic pain EXAM: DG HIP (WITH OR WITHOUT PELVIS) 2-3V RIGHT COMPARISON:  None. FINDINGS: No fracture or dislocation is seen. Bilateral hip joint spaces are preserved. Visualized bony pelvis appears intact. IMPRESSION: Negative. Electronically Signed   By: Julian Hy M.D.   On: 10/13/2018 03:23    Procedures Procedures (including critical care time)  Medications Ordered in ED Medications  HYDROcodone-acetaminophen (NORCO/VICODIN) 5-325 MG per tablet 2 tablet (2 tablets Oral Given 10/13/18 0336)     Initial Impression / Assessment and Plan / ED Course  I have reviewed the triage vital signs and the  nursing notes.  Pertinent labs & imaging results that were available during my care of the patient were reviewed by me and considered in my medical decision making (see chart for details).        4:17 AM Imaging today is reassuring and negative for fracture.  This was conveyed to the patient who verbalizes understanding.  She is very anxious and concerned about the fact that her pain has been worsening and not getting better.  She states that she cannot walk due to severity of her pain.  Continues to have reproducible tenderness to her pelvis.  Abdomen remains soft.  Will add  on urine as well as CT pelvis.  If CT negative, will reattempt order for ambulation.  5:40 AM CT scan shows subacute bilateral sacral insufficiency fractures as well as a subacute nondisplaced right pubic body fracture.  This explains the area of patient's pain.  There is also evidence of muscle strain on imaging.  She has no concern for infection based on her physical exam and history.  These findings were discussed with the patient who verbalizes understanding.  We will consult with orthopedics to discuss proper outpatient precautions.  6:09 AM Case discussed with Dr. Erlinda Hong of Orthopedics. Comfortable with plan for WBAT and outpatient follow up. Encouraged use of crutches or walker PRN. Happy to see patient in the office for continued care.  6:40 AM Patient updated on plan.  She does have a walker at home, but not a walker with a seat.  Will give prescription for with seat to use and assist in ambulation.  Also prescribed a short course of Norco for pain control.  She has been instructed to follow-up with orthopedics and verbalizes understanding.  Return precautions discussed and provided. Patient discharged in stable condition with no unaddressed concerns.   Final Clinical Impressions(s) / ED Diagnoses   Final diagnoses:  Sacral insufficiency fracture, initial encounter  Unspecified fracture of right pubis, initial  encounter for closed fracture [facility])    ED Discharge Orders         Ordered    HYDROcodone-acetaminophen (NORCO/VICODIN) 5-325 MG tablet  Every 6 hours PRN     10/13/18 0628    For home use only DME 4 wheeled rolling walker with seat     10/13/18 0628           Antonietta Breach, PA-C 10/13/18 0641    Maudie Flakes, MD 10/13/18 [phone]

## 2018-10-27 DIAGNOSIS — S32591A Other specified fracture of right pubis, initial encounter for closed fracture: Secondary | ICD-10-CM

## 2018-10-27 HISTORY — DX: Other specified fracture of right pubis, initial encounter for closed fracture: S32.591A

## 2018-11-17 DIAGNOSIS — H6122 Impacted cerumen, left ear: Secondary | ICD-10-CM | POA: Insufficient documentation

## 2018-11-17 HISTORY — DX: Impacted cerumen, left ear: H61.22

## 2019-01-13 DIAGNOSIS — L03012 Cellulitis of left finger: Secondary | ICD-10-CM | POA: Insufficient documentation

## 2019-01-13 HISTORY — DX: Cellulitis of left finger: L03.012

## 2019-02-03 DIAGNOSIS — M4317 Spondylolisthesis, lumbosacral region: Secondary | ICD-10-CM | POA: Insufficient documentation

## 2019-02-03 DIAGNOSIS — M869 Osteomyelitis, unspecified: Secondary | ICD-10-CM | POA: Insufficient documentation

## 2019-02-03 HISTORY — DX: Spondylolisthesis, lumbosacral region: M43.17

## 2019-02-03 HISTORY — DX: Osteomyelitis, unspecified: M86.9

## 2019-04-16 ENCOUNTER — Encounter: Payer: Self-pay | Admitting: Internal Medicine

## 2019-04-22 ENCOUNTER — Ambulatory Visit: Payer: Medicaid Other | Admitting: Cardiology

## 2019-04-29 ENCOUNTER — Ambulatory Visit: Payer: Medicaid Other | Admitting: Internal Medicine

## 2019-04-29 ENCOUNTER — Other Ambulatory Visit: Payer: Self-pay

## 2019-04-29 ENCOUNTER — Institutional Professional Consult (permissible substitution): Payer: Medicaid Other | Admitting: Internal Medicine

## 2019-04-29 ENCOUNTER — Encounter: Payer: Self-pay | Admitting: Internal Medicine

## 2019-04-29 VITALS — BP 128/80 | Temp 98.4°F | Ht 62.0 in | Wt 178.0 lb

## 2019-04-29 DIAGNOSIS — F17200 Nicotine dependence, unspecified, uncomplicated: Secondary | ICD-10-CM

## 2019-04-29 DIAGNOSIS — J449 Chronic obstructive pulmonary disease, unspecified: Secondary | ICD-10-CM | POA: Diagnosis not present

## 2019-04-29 MED ORDER — ALBUTEROL SULFATE HFA 108 (90 BASE) MCG/ACT IN AERS: 2.0000 | INHALATION_SPRAY | RESPIRATORY_TRACT | Status: DC | PRN

## 2019-04-29 MED ORDER — VARENICLINE TARTRATE 0.5 MG X 11 & 1 MG X 42 PO MISC: ORAL | 0 refills | Status: DC

## 2019-04-29 MED ORDER — MOMETASONE FURO-FORMOTEROL FUM 100-5 MCG/ACT IN AERO: 2.0000 | INHALATION_SPRAY | Freq: Two times a day (BID) | RESPIRATORY_TRACT | 5 refills | Status: DC

## 2019-04-29 NOTE — Progress Notes (Signed)
Shirley Williams    [id number]    [birth year]  Primary Care Physician:Penner, Olin Hauser, MD  Referring Physician: Greig Right, MD [address] [place],  [place] [address] Reason for Consultation: "COPD" Date of Consultation: 04/29/2019  Chief complaint:   Chief Complaint  Patient presents with  . Consult    Referred by Dr. Burnett Sheng for COPD     HPI: Shirley Williams is a 64 year old woman with a history of active tobacco use disorder who presents with new patient evaluation for COPD.  Diagnosed with COPD by PCP 5 years ago.  Now having Worsening shortness of breath. Formerly more active but, Had a broken pelvis last year and is currently under physical therapy to help get back on her feet. Breathing is worse since she has been doing therapy and now using albuterol 2-3 times/day but is now getting stronger.   Had quit smoking with chantix before for 7-8 months, but started back up again this year because of stress.    On Dulera 2 puffs in the morning only.  On albuterol less than 1-2 times/week. Currently out of this. Makes her burp sulfur drugs.   Has GERD on nexium. Barrett's Esophagus.   She has wheezing and cough. Cough is worse first thing in the morning and late in the evening. No nighttime symptoms.  Stress and anxiety play a lot into her smoking.  She is interested in quitting smoking.  Social history: Occupation: worked in [employer]: lives at home with husband dogs and cats Smoking history: 1ppd x 50 years = 50 pack years.   Social History   Occupational History  . Not on file  Tobacco Use  . Smoking status: Current Every Day Smoker    Packs/day: 1.00    Types: Cigarettes  . Smokeless tobacco: Never Used  . Tobacco comment: 1/2 pack daily  Substance and Sexual Activity  . Alcohol use: Yes    Comment: occ  . Drug use: No  . Sexual activity: Yes    Birth control/protection: Post-menopausal    Relevant family history: Family History    Problem Relation Age of Onset  . COPD Mother   . Cancer Father        brain  . Asthma Sister   . Depression Sister     Past Medical History:  Diagnosis Date  . Arthritis   . Depression   . GERD (gastroesophageal reflux disease)     Past Surgical History:  Procedure Laterality Date  . APPENDECTOMY    . LAPAROSCOPIC APPENDECTOMY  01/15/2012   Procedure: APPENDECTOMY LAPAROSCOPIC;  Surgeon: Imogene Burn. Georgette Dover, MD;  Location: [facility];  Service: General;  Laterality: N/A;  . TONSILLECTOMY      Review of systems: Review of Systems  Constitutional: Negative for chills, fever and weight loss.  HENT: Negative for congestion, sinus pain and sore throat.   Eyes: Negative for discharge and redness.  Respiratory: Positive for cough, sputum production, shortness of breath and wheezing. Negative for hemoptysis.   Cardiovascular: Negative for chest pain, palpitations and leg swelling.  Gastrointestinal: Negative for heartburn, nausea and vomiting.  Musculoskeletal: Positive for joint pain. Negative for myalgias.  Skin: Negative for rash.  Neurological: Negative for dizziness, tremors, focal weakness and headaches.  Endo/Heme/Allergies: Negative for environmental allergies.  Psychiatric/Behavioral: Negative for depression. The patient is not nervous/anxious.   All other systems reviewed and are negative.   Physical Exam: Blood pressure 128/80, temperature 98.4 F (  36.9 C), temperature source Temporal, height 5\' 2"  (1.575 m), weight 178 lb (80.7 kg), last menstrual period 12/04/2011. Gen:      No acute distress Eyes: EOMI, sclera anicteric ENT:  no nasal polyps, mucus membranes moist Lungs:    Diminished, no increased respiratory effort, symmetric chest wall excursion, clear to auscultation bilaterally, no wheezes or crackles CV:         Regular rate and rhythm; no murmurs, rubs, or gallops.  No pedal edema Neuro: normal speech, no focal facial asymmetry Psych: alert and oriented x3,  normal mood and affect   Data Reviewed/Medical Decision Making:  Independent interpretation of tests: Imaging: . Review of patient's CT abdomen pelvis from 2013 lung windows images revealed bibasilar atelectasis, no emphysema, no lung nodules. The patient's images have been independently reviewed by me.    PFTs:  None available  Labs:  Lab Results  Component Value Date   WBC 14.0 (H) 01/15/2012   HGB 14.1 01/15/2012   HCT 41.7 01/15/2012   MCV 88.9 01/15/2012   PLT 204 01/15/2012     Chemistry      Component Value Date/Time   NA 138 01/15/2012 1239   K 3.6 01/15/2012 1239   CL 104 01/15/2012 1239   CO2 24 01/15/2012 1239   BUN 14 01/15/2012 1239   CREATININE 0.86 01/15/2012 1239      Component Value Date/Time   CALCIUM 9.3 01/15/2012 1239   ALKPHOS 59 01/15/2012 1239   AST 16 01/15/2012 1239   ALT 10 01/15/2012 1239   BILITOT 0.6 01/15/2012 1239      Echo: 1. The left ventricle has normal systolic function with an ejection  fraction of 60-65%. The cavity size was normal. Left ventricular diastolic  Doppler parameters are consistent with impaired relaxation. No evidence of  left ventricular regional wall  motion abnormalities.  2. The right ventricle has normal systolic function. The cavity was  normal. There is no increase in right ventricular wall thickness.  3. No evidence of mitral valve stenosis.  4. No stenosis of the aortic valve   . I reviewed prior external note(s) from Dr. Burnett Sheng . I reviewed the result(s) of the labs and imaging as noted above.  . I have ordered PFTs  Assessment:  COPD, worsening Tobacco Use Disorder, Smoking Cessation Counseling  I personally spent 10 minutes counseling the patient regarding tobacco use disorder.  Patient is symptomatic from tobacco use disorder due to the following condition: copd.  The patient's response was contemplative.  We discussed nicotine replacement therapy, Wellbutrin, Chantix.  We identified to  gather patient specific barriers to change.  The patient is open to future discussions about tobacco cessation.   Plan/Recommendations: I have instructed her to start taking her Dulera twice a day, she is currently taking once a day.  Continue albuterol as needed. We will start Chantix for tobacco use disorder cessation. We will get a full set of PFTs. We will refer her to our screening program for LDCT scan for lung cancer.   Return to Care: Return in about 3 months (around 07/27/2019).  Lenice Llamas, MD Pulmonary and [place]  CC: Greig Right, MD

## 2019-04-29 NOTE — Patient Instructions (Signed)
The patient should have follow up scheduled with myself in 3 months.   Prior to next visit patient should have: Full set of PFTs  Varenicline -- Varenicline (brand name: Chantix) is a prescription medication that works in the brain to reduce nicotine withdrawal symptoms and cigarette cravings. In several studies, it was more effective than placebo (a lookalike substitute that contains no medication.)  It should be taken after eating with a full glass of water as follows: ?One 0.5 mg tablet daily for three days ?One 0.5 mg tablet twice daily for the next four days ?One 1.0 mg tablet twice daily starting at day 7  You should plan to quit smoking between one and four weeks after starting varenicline.   You should continue it for 12 weeks before concluding if it is working; if you successfully quit at 12 weeks, you may continue taking it for an additional 12 weeks. If you have not quit after taking varenicline for 12 weeks, talk to your health care provider about the next step. Options include working harder to make behavioral changes and continuing varenicline or switching to another treatment.  Common side effects of varenicline include nausea and abnormal dreams.  In the very early years of Chantix there were some concerns about Chantix affecting mood and depression.  However, there has been lots of research on this and this is no longer a concern.  Chantix is considered safe to use even in patients taking medication for depression.   In 2011, the [place] Food and Drug Administration (FDA) issued an advisory that, in people who already have heart or blood vessel disease, varenicline may increase the risk of acute heart problems. If there is such a risk, it appears to be small, and is likely outweighed by the benefits of quitting smoking.

## 2019-04-30 ENCOUNTER — Other Ambulatory Visit: Payer: Self-pay | Admitting: Internal Medicine

## 2019-04-30 ENCOUNTER — Telehealth: Payer: Self-pay

## 2019-04-30 DIAGNOSIS — J439 Emphysema, unspecified: Secondary | ICD-10-CM

## 2019-04-30 DIAGNOSIS — F17200 Nicotine dependence, unspecified, uncomplicated: Secondary | ICD-10-CM

## 2019-04-30 DIAGNOSIS — J449 Chronic obstructive pulmonary disease, unspecified: Secondary | ICD-10-CM

## 2019-04-30 NOTE — Telephone Encounter (Signed)
Order has been placed for lung cancer screening program.

## 2019-04-30 NOTE — Telephone Encounter (Signed)
-----   Message from Spero Geralds, MD sent at 04/29/2019  6:07 PM EST ----- Shirley Williams can you help me get her in to see Sarah for Lung cancer screening program? Thank you

## 2019-05-11 DIAGNOSIS — M65331 Trigger finger, right middle finger: Secondary | ICD-10-CM

## 2019-05-11 HISTORY — DX: Trigger finger, right middle finger: M65.331

## 2019-05-14 ENCOUNTER — Encounter: Payer: Self-pay | Admitting: Cardiology

## 2019-05-14 ENCOUNTER — Other Ambulatory Visit: Payer: Self-pay

## 2019-05-14 ENCOUNTER — Ambulatory Visit (INDEPENDENT_AMBULATORY_CARE_PROVIDER_SITE_OTHER): Payer: Medicaid Other | Admitting: Cardiology

## 2019-05-14 VITALS — BP 126/72 | HR 76 | Ht 62.0 in | Wt 170.0 lb

## 2019-05-14 DIAGNOSIS — E782 Mixed hyperlipidemia: Secondary | ICD-10-CM | POA: Diagnosis not present

## 2019-05-14 DIAGNOSIS — I10 Essential (primary) hypertension: Secondary | ICD-10-CM

## 2019-05-14 NOTE — Patient Instructions (Signed)
Medication Instructions:  Your physician has recommended you make the following change in your medication:   Hold Losartan.  *If you need a refill on your cardiac medications before your next appointment, please call your pharmacy*   Lab Work: None ordered If you have labs (blood work) drawn today and your tests are completely normal, you will receive your results only by: Marland Kitchen MyChart Message (if you have MyChart) OR . A paper copy in the mail If you have any lab test that is abnormal or we need to change your treatment, we will call you to review the results.   Testing/Procedures: None ordered   Follow-Up: At [facility], you and your health needs are our priority.  As part of our continuing mission to provide you with exceptional heart care, we have created designated Provider Care Teams.  These Care Teams include your primary Cardiologist (physician) and Advanced Practice Providers (APPs -  Physician Assistants and Nurse Practitioners) who all work together to provide you with the care you need, when you need it.  We recommend signing up for the patient portal called "MyChart".  Sign up information is provided on this After Visit Summary.  MyChart is used to connect with patients for Virtual Visits (Telemedicine).  Patients are able to view lab/test results, encounter notes, upcoming appointments, etc.  Non-urgent messages can be sent to your provider as well.   To learn more about what you can do with MyChart, go to [email or web address].    Your next appointment:   6 month(s)  The format for your next appointment:   In Person  Provider:   Jyl Heinz, MD   Other Instructions NA

## 2019-05-14 NOTE — Progress Notes (Signed)
Cardiology Office Note:    Date:  05/14/2019   ID:  Shirley Williams, DOB 01-13-1956, MRN [id number]  PCP:  Greig Right, MD  Cardiologist:  Jenean Lindau, MD   Referring MD: Greig Right, MD    ASSESSMENT:    1. Essential hypertension   2. Mixed hyperlipidemia    PLAN:    In order of problems listed above:  1. I discussed my findings with the patient at extensive length and primary prevention stressed.  Importance of compliance with diet and medication stressed and she vocalized understanding. 2. Hypertension: Her blood pressure is borderline.  I have asked her to stop losartan at this time.  She is on a low-dose.  Hopefully this might help some of her fatigue.  Her blood work done by her primary care physician was within normal limits. 3. Mixed dyslipidemia: Lipids followed by her primary care physician and recent blood work was fine. 4. Patient will be seen in follow-up appointment in 6 months or earlier if the patient has any concerns    Medication Adjustments/Labs and Tests Ordered: Current medicines are reviewed at length with the patient today.  Concerns regarding medicines are outlined above.  No orders of the defined types were placed in this encounter.  No orders of the defined types were placed in this encounter.    Chief Complaint  Patient presents with  . Follow-up    6 Months     History of Present Illness:    Shirley Williams is a 64 y.o. female.  Patient has past medical history of essential hypertension and dyslipidemia.  Overall she is doing fine.  She is recovering from an accident and is getting physical therapy.  She complains of significant fatigue.  No chest pain orthopnea or PND.  At the time of my evaluation, the patient is alert awake oriented and in no distress.  Past Medical History:  Diagnosis Date  . Arthritis   . Depression   . GERD (gastroesophageal reflux disease)     Past Surgical History:  Procedure Laterality Date  .  APPENDECTOMY    . LAPAROSCOPIC APPENDECTOMY  01/15/2012   Procedure: APPENDECTOMY LAPAROSCOPIC;  Surgeon: Imogene Burn. Georgette Dover, MD;  Location: [facility];  Service: General;  Laterality: N/A;  . TONSILLECTOMY      Current Medications: Current Meds  Medication Sig  . ALPRAZolam (XANAX) 0.5 MG tablet Take 1 tablet by mouth 2 (two) times a day.  Marland Kitchen atorvastatin (LIPITOR) 40 MG tablet Take 1 tablet (40 mg total) by mouth every evening. Fir high Cholesterol/fat  . diclofenac (VOLTAREN) 75 MG EC tablet Take by mouth.  . diphenoxylate-atropine (LOMOTIL) 2.5-0.025 MG tablet Take by mouth 4 (four) times daily as needed for diarrhea or loose stools.  . DULoxetine (CYMBALTA) 60 MG capsule Take 1 capsule by mouth daily.  Marland Kitchen esomeprazole (NEXIUM) 40 MG capsule Take 40 mg by mouth daily at 12 noon.  . fluticasone (FLONASE) 50 MCG/ACT nasal spray Place 2 sprays into both nostrils every evening. For allergies  . gabapentin (NEURONTIN) 300 MG capsule Take 1 capsule by mouth daily.  Marland Kitchen ibuprofen (ADVIL) 800 MG tablet Take 1 tablet (800 mg total) by mouth 3 (three) times daily.  . mirtazapine (REMERON) 15 MG tablet Take 1 tablet by mouth daily.  . mometasone-formoterol (DULERA) 100-5 MCG/ACT AERO Inhale 2 puffs into the lungs 2 (two) times daily.  Marland Kitchen PROAIR HFA 108 (90 Base) MCG/ACT inhaler INHALE 2 PUFFS BY MOUTH EVERY 4 TO 6 HOURS  AS NEEDED  . topiramate (TOPAMAX) 50 MG tablet Take 1 tablet (50 mg total) by mouth daily. For mood stabilization  . varenicline (CHANTIX PAK) 0.5 MG X 11 & 1 MG X 42 tablet Take one 0.5 mg tablet by mouth once daily for 3 days, then increase to one 0.5 mg tablet twice daily for 4 days, then increase to one 1 mg tablet twice daily.     Allergies:   Sulfa antibiotics, Sulfacetamide sodium, and Tape   Social History   Socioeconomic History  . Marital status: Married    Spouse name: Not on file  . Number of children: Not on file  . Years of education: Not on file  . Highest education  level: Not on file  Occupational History  . Not on file  Tobacco Use  . Smoking status: Current Every Day Smoker    Packs/day: 1.00    Types: Cigarettes  . Smokeless tobacco: Never Used  . Tobacco comment: 1/2 pack daily  Substance and Sexual Activity  . Alcohol use: Yes    Comment: occ  . Drug use: No  . Sexual activity: Yes    Birth control/protection: Post-menopausal  Other Topics Concern  . Not on file  Social History Narrative  . Not on file   Social Determinants of Health   Financial Resource Strain:   . Difficulty of Paying Living Expenses:   Food Insecurity:   . Worried About Charity fundraiser in the Last Year:   . Arboriculturist in the Last Year:   Transportation Needs:   . Film/video editor (Medical):   Marland Kitchen Lack of Transportation (Non-Medical):   Physical Activity:   . Days of Exercise per Week:   . Minutes of Exercise per Session:   Stress:   . Feeling of Stress :   Social Connections:   . Frequency of Communication with Friends and Family:   . Frequency of Social Gatherings with Friends and Family:   . Attends Religious Services:   . Active Member of Clubs or Organizations:   . Attends Archivist Meetings:   Marland Kitchen Marital Status:      Family History: The patient's family history includes Asthma in her sister; COPD in her mother; Cancer in her father; Depression in her sister.  ROS:   Please see the history of present illness.    All other systems reviewed and are negative.  EKGs/Labs/Other Studies Reviewed:    The following studies were reviewed today: EVENT MONITOR REPORT:   Patient was monitored from 10/12/2018 to 10/21/2018. Indication:                    Palpitations Ordering physician:  Jenean Lindau, MD  Referring physician:        Jenean Lindau, MD    Baseline rhythm: Sinus  Minimum heart rate: 60 BPM.  Average heart rate: 90 BPM.  Maximal heart rate 136 BPM.  Atrial arrhythmia: Rare atrial runs, longest was 14  beats at 194/min  Ventricular arrhythmia: Occasional PVCs  Conduction abnormality: None significant  Symptoms: None significant   Conclusion:  Mildly abnormal but overall unremarkable event monitoring.  Brief atrial runs were noted.  Asymptomatic.  Interpreting  cardiologist: Jenean Lindau, MD  Date: 11/02/2018 7:00 PM   IMPRESSIONS    1. The left ventricle has normal systolic function with an ejection  fraction of 60-65%. The cavity size was normal. Left ventricular diastolic  Doppler parameters are consistent  with impaired relaxation. No evidence of  left ventricular regional wall  motion abnormalities.  2. The right ventricle has normal systolic function. The cavity was  normal. There is no increase in right ventricular wall thickness.  3. No evidence of mitral valve stenosis.  4. No stenosis of the aortic valve.    Study Highlights   The left ventricular ejection fraction is hyperdynamic (>65%).  Nuclear stress EF: 68%.  There was no ST segment deviation noted during stress.  The study is normal.  This is a low risk study.       Recent Labs: No results found for requested labs within last 8760 hours.  Recent Lipid Panel    Component Value Date/Time   CHOL 223 (H) 10/05/2015 0627   TRIG 175 (H) 10/05/2015 0627   HDL 65 10/05/2015 0627   CHOLHDL 3.4 10/05/2015 0627   VLDL 35 10/05/2015 0627   LDLCALC 123 (H) 10/05/2015 0627    Physical Exam:    VS:  BP 126/72   Pulse 76   Ht 5\' 2"  (1.575 m)   Wt 170 lb (77.1 kg)   LMP 12/04/2011   SpO2 98%   BMI 31.09 kg/m     Wt Readings from Last 3 Encounters:  05/14/19 170 lb (77.1 kg)  04/29/19 178 lb (80.7 kg)  10/07/18 181 lb 1.9 oz (82.2 kg)     GEN: Patient is in no acute distress HEENT: Normal NECK: No JVD; No carotid bruits LYMPHATICS: No lymphadenopathy CARDIAC: Hear sounds regular, 2/6 systolic murmur at the apex. RESPIRATORY:  Clear to auscultation without rales, wheezing or  rhonchi  ABDOMEN: Soft, non-tender, non-distended MUSCULOSKELETAL:  No edema; No deformity  SKIN: Warm and dry NEUROLOGIC:  Alert and oriented x 3 PSYCHIATRIC:  Normal affect   Signed, Jenean Lindau, MD  05/14/2019 1:54 PM    [facility]

## 2019-05-18 ENCOUNTER — Other Ambulatory Visit: Payer: Self-pay | Admitting: Acute Care

## 2019-05-18 ENCOUNTER — Encounter: Payer: Self-pay | Admitting: Acute Care

## 2019-05-18 ENCOUNTER — Other Ambulatory Visit: Payer: Self-pay

## 2019-05-18 DIAGNOSIS — F17210 Nicotine dependence, cigarettes, uncomplicated: Secondary | ICD-10-CM

## 2019-05-18 NOTE — Progress Notes (Addendum)
Shared Decision-Making Visit for Lung Cancer Screening [phone] )  Lung Cancer Screening Criteria 30-88-years of age 65+ pack year smoking history>> 36 pack year smoking history No Recent History of coughing up blood   No Unexplained weight loss of > 15 pounds in the last 6 months. No Prior History Lung / other cancer  (Diagnosis within the last 5 years already requiring surveillance chest CT Scans). Pt is a current smoker, or former smoker who has quit within the last 15 years.  This patient meets the criterial noted above and is asymptomatic for any signs or symptoms of lung cancer.  The Shared Decision-Making Visit discussion included risks and benefits of screening, potential for follow up diagnostic testing for abnormal scans, potential for false positive tests, over diagnosis, and discussion about total radiation exposure. Patient stated willingness to undergo diagnostics and treatment as needed. Current smokers were counseled on smoking cessation as the single most powerful action they can take to decrease their risk of lung cancer, pulmonary disease, heart disease and stroke. They were given a resource card with information on receiving free nicotine replacement therapy , and information about free smoking cessation classes.  Pt understands that this scan is being paid for by a grant obtained by the [facility]. We discussed  that there is no guarantee of additional grant money from year to year as these grants are not guaranteed to programs on an annual basis.They have to be applied for and they are awarded based on county need, and utilization of the previous years funds..   Current every day smoker with a 36 pack year smoking history She is currently not interested in quitting smoking Ticket #2 for the 05/18/2019 screening clinic  Magdalen Spatz, MSN, AGACNP-BC [place] Pager # [phone] After 4 pm please call  [phone] 05/18/2019

## 2019-05-21 ENCOUNTER — Other Ambulatory Visit: Payer: Self-pay | Admitting: *Deleted

## 2019-05-21 DIAGNOSIS — F17210 Nicotine dependence, cigarettes, uncomplicated: Secondary | ICD-10-CM

## 2019-05-22 ENCOUNTER — Other Ambulatory Visit: Payer: Self-pay | Admitting: Internal Medicine

## 2019-05-22 DIAGNOSIS — F17200 Nicotine dependence, unspecified, uncomplicated: Secondary | ICD-10-CM

## 2019-06-08 ENCOUNTER — Other Ambulatory Visit: Payer: Self-pay

## 2019-06-08 ENCOUNTER — Ambulatory Visit
Admission: RE | Admit: 2019-06-08 | Discharge: 2019-06-08 | Disposition: A | Discharge status: Home / Self Care | Payer: No Typology Code available for payment source | Source: Ambulatory Visit | Attending: Acute Care | Admitting: Acute Care

## 2019-06-08 DIAGNOSIS — F17210 Nicotine dependence, cigarettes, uncomplicated: Secondary | ICD-10-CM

## 2019-06-09 NOTE — Progress Notes (Signed)
Please call patient and let them  know their  low dose Ct was read as a Lung RADS 2: nodules that are benign in appearance and behavior with a very low likelihood of becoming a clinically active cancer due to size or lack of growth. Recommendation per radiology is for a repeat LDCT in 12 months. .Please let them  know we will order and schedule their  annual screening scan for 06/2020. Please let them  know there was notation of CAD on their  scan.  Please remind the patient  that this is a non-gated exam therefore degree or severity of disease  cannot be determined. Please have them  follow up with their PCP regarding potential risk factor modification, dietary therapy or pharmacologic therapy if clinically indicated. Pt.  is currently on statin therapy. Please place order for annual  screening scan for  06/2020 and fax results to PCP. Thanks so much. 

## 2019-06-12 ENCOUNTER — Telehealth: Payer: Self-pay | Admitting: Internal Medicine

## 2019-06-12 DIAGNOSIS — J449 Chronic obstructive pulmonary disease, unspecified: Secondary | ICD-10-CM

## 2019-06-12 MED ORDER — AZITHROMYCIN 250 MG PO TABS: ORAL_TABLET | ORAL | 0 refills | Status: DC

## 2019-06-12 MED ORDER — PREDNISONE 10 MG PO TABS: ORAL_TABLET | ORAL | 0 refills | Status: DC

## 2019-06-12 NOTE — Telephone Encounter (Signed)
Patient states she has been more sob than usual for about a week. Reports she is using her dulera daily. She reports she coughs up yellow and green mucous. She reports she is taking something OTC for her allergies. She denies chest pain. States it just feels like a heaviness in her chest.   Per Wyn Quaker, zpack with 2 week f/u. Verbal order give. Meds sent. Pharmacy confirmed.   Nothing further needed at this time.

## 2019-06-12 NOTE — Telephone Encounter (Signed)
06/12/2019  Patient with known emphysema.  She still current smoker.  She needs to maintain her inhalers.  She recently stop smoking.  Okay to call in:  Prednisone 10mg  tablet  >>>4 tabs for 2 days, then 3 tabs for 2 days, 2 tabs for 2 days, then 1 tab for 2 days, then stop >>>take with food  >>>take in the morning   Azithromycin 250mg  tablet  >>>Take 2 tablets (500mg  total) today, and then 1 tablet (250mg ) for the next four days  >>>take with food  >>>can also take probiotic and / or yogurt while on antibiotic   Okay to place the prescription.  Patient needs 2-week follow-up in our office to ensure his symptoms are improving.    If symptoms worsen she needs to seek emergent care.  Continue to not smoke.  Wyn Quaker, FNP

## 2019-06-15 ENCOUNTER — Other Ambulatory Visit: Payer: Self-pay | Admitting: *Deleted

## 2019-06-15 DIAGNOSIS — F17210 Nicotine dependence, cigarettes, uncomplicated: Secondary | ICD-10-CM

## 2019-06-15 DIAGNOSIS — Z87891 Personal history of nicotine dependence: Secondary | ICD-10-CM

## 2019-06-24 ENCOUNTER — Other Ambulatory Visit: Payer: Self-pay | Admitting: Internal Medicine

## 2019-06-26 ENCOUNTER — Telehealth: Payer: Self-pay | Admitting: Internal Medicine

## 2019-06-26 NOTE — Telephone Encounter (Signed)
I called Shirley Williams for that Shirley Williams to see about a PA for Chantix and was told the issue was Shirley Williams cant get more than 6 month supply in 12 month period and Shirley Williams has so they wont cover it. Shirley Williams will have to pay cash if Shirley Williams wants it.   Shirley Williams is aware will forward to Dr. Shearon Stalls to see if Shirley Williams wants to try something else for Shirley Williams.   Dr. Shearon Stalls please advise

## 2019-06-26 NOTE — Telephone Encounter (Signed)
LMTCB x1 for pt.  

## 2019-06-29 NOTE — Telephone Encounter (Signed)
Will medicaid cover a 6 month supply? Can we just give her that instead?

## 2019-06-30 NOTE — Telephone Encounter (Signed)
No, she has already got the amount she is allowed. If she wants more she will have to pay out of pocket. They said in 2 months she can get a 1 month and then that's it.

## 2019-07-04 ENCOUNTER — Other Ambulatory Visit (HOSPITAL_COMMUNITY): Admission: RE | Admit: 2019-07-04 | Payer: Medicaid Other | Source: Ambulatory Visit

## 2019-07-06 ENCOUNTER — Telehealth: Payer: Self-pay | Admitting: Internal Medicine

## 2019-07-06 DIAGNOSIS — Z716 Tobacco abuse counseling: Secondary | ICD-10-CM

## 2019-07-06 MED ORDER — BUPROPION HCL ER (SR) 150 MG PO TB12: 150.0000 mg | ORAL_TABLET | Freq: Two times a day (BID) | ORAL | 5 refills | Status: DC

## 2019-07-06 NOTE — Telephone Encounter (Signed)
Called spoke with patient and discussed at length Dr Mauricio Po recommendations as stated below.  Recommendations read to patient while she took notes.  Patient repeated information back to me.  Patient aware Rx has been sent to [facility] and will keep her 6/9 PFT and 6/10 appt with Dr Shearon Stalls. Nothing further needed at this time; will sign off.

## 2019-07-06 NOTE — Telephone Encounter (Signed)
Called spoke with patient who reported she has been on the Chantix x2 months.  She stated that she had been on it in the past and it did not work successfully for her.  Patient stated that since the Chantix didn't work completely for her in the past and insurance doesn't seem to want to pay for it (see 4.23.21 phone note) then she would like to try Wellbutrin to see if this will help with smoking cessation.  Patient has been off the Chantix x2 weeks and has smoked 5 cigarettes total since then but reports being under a lot of stress.  Dr Shearon Stalls please advise, thank you.

## 2019-07-06 NOTE — Telephone Encounter (Signed)
Stop chantix. Ok to stop outright without tapering. Start taking wellbutrin as instructed below. Sent to pharmacy. Keep appointment with me in June.   Bupropion -- Bupropion (brand names: Zyban, Wellbutrin) is an antidepressant that can be used to help you stop smoking.  Start taking it once per day for three days, then increase to twice daily starting four weeks before the quit date.  You should typically continue for 7 to 12 weeks after you quit smoking.  Please do not stop taking this medication abruptly.  When you are ready to stop we will have you go to once daily for two weeks and then you can do once every other day for a week before you stop.   Bupropion is generally well-tolerated, but it may cause dry mouth and difficulty sleeping. The drug should not be used by people who have a seizure disorder or bipolar (manic-depressive) disorder, and it is not recommended for those who have head trauma, anorexia nervosa, or bulimia, or for those who drink alcohol excessively.

## 2019-07-13 ENCOUNTER — Ambulatory Visit: Payer: Medicaid Other | Admitting: Internal Medicine

## 2019-08-08 ENCOUNTER — Other Ambulatory Visit (HOSPITAL_COMMUNITY)
Admission: RE | Admit: 2019-08-08 | Discharge: 2019-08-08 | Disposition: A | Discharge status: Home / Self Care | Payer: Medicaid Other | Source: Ambulatory Visit | Attending: Internal Medicine | Admitting: Internal Medicine

## 2019-08-08 DIAGNOSIS — Z01812 Encounter for preprocedural laboratory examination: Secondary | ICD-10-CM | POA: Insufficient documentation

## 2019-08-08 DIAGNOSIS — Z20822 Contact with and (suspected) exposure to covid-19: Secondary | ICD-10-CM | POA: Insufficient documentation

## 2019-08-08 LAB — SARS CORONAVIRUS 2 (TAT 6-24 HRS): SARS Coronavirus 2: NEGATIVE

## 2019-08-12 ENCOUNTER — Ambulatory Visit (INDEPENDENT_AMBULATORY_CARE_PROVIDER_SITE_OTHER): Payer: Medicaid Other | Admitting: Internal Medicine

## 2019-08-12 ENCOUNTER — Other Ambulatory Visit: Payer: Self-pay

## 2019-08-12 DIAGNOSIS — J449 Chronic obstructive pulmonary disease, unspecified: Secondary | ICD-10-CM

## 2019-08-12 LAB — PULMONARY FUNCTION TEST
DL/VA % pred: 70 %
DL/VA: 3 ml/min/mmHg/L
DLCO cor % pred: 82 %
DLCO cor: 15.27 ml/min/mmHg
DLCO unc % pred: 82 %
DLCO unc: 15.27 ml/min/mmHg
FEF 25-75 Post: 1.38 L/sec
FEF 25-75 Pre: 1.1 L/sec
FEF2575-%Change-Post: 24 %
FEF2575-%Pred-Post: 66 %
FEF2575-%Pred-Pre: 53 %
FEV1-%Change-Post: 4 %
FEV1-%Pred-Post: 91 %
FEV1-%Pred-Pre: 87 %
FEV1-Post: 2.07 L
FEV1-Pre: 1.98 L
FEV1FVC-%Change-Post: 0 %
FEV1FVC-%Pred-Pre: 87 %
FEV6-%Change-Post: 2 %
FEV6-%Pred-Post: 104 %
FEV6-%Pred-Pre: 101 %
FEV6-Post: 2.95 L
FEV6-Pre: 2.88 L
FEV6FVC-%Change-Post: -1 %
FEV6FVC-%Pred-Post: 100 %
FEV6FVC-%Pred-Pre: 102 %
FVC-%Change-Post: 5 %
FVC-%Pred-Post: 104 %
FVC-%Pred-Pre: 99 %
FVC-Post: 3.08 L
FVC-Pre: 2.92 L
Post FEV1/FVC ratio: 67 %
Post FEV6/FVC ratio: 97 %
Pre FEV1/FVC ratio: 68 %
Pre FEV6/FVC Ratio: 98 %
RV % pred: 124 %
RV: 2.45 L
TLC % pred: 117 %
TLC: 5.58 L

## 2019-08-12 NOTE — Progress Notes (Signed)
PFT done today. 

## 2019-08-13 ENCOUNTER — Encounter: Payer: Self-pay | Admitting: Pulmonary Disease

## 2019-08-13 ENCOUNTER — Ambulatory Visit: Payer: Medicaid Other | Admitting: Pulmonary Disease

## 2019-08-13 ENCOUNTER — Ambulatory Visit: Payer: Medicaid Other | Admitting: Internal Medicine

## 2019-08-13 ENCOUNTER — Telehealth: Payer: Self-pay | Admitting: Pulmonary Disease

## 2019-08-13 VITALS — BP 130/78 | HR 88 | Temp 98.3°F | Ht 62.0 in | Wt 169.0 lb

## 2019-08-13 DIAGNOSIS — J449 Chronic obstructive pulmonary disease, unspecified: Secondary | ICD-10-CM

## 2019-08-13 DIAGNOSIS — F17210 Nicotine dependence, cigarettes, uncomplicated: Secondary | ICD-10-CM

## 2019-08-13 DIAGNOSIS — F17200 Nicotine dependence, unspecified, uncomplicated: Secondary | ICD-10-CM

## 2019-08-13 DIAGNOSIS — J4489 Other specified chronic obstructive pulmonary disease: Secondary | ICD-10-CM

## 2019-08-13 DIAGNOSIS — Z79899 Other long term (current) drug therapy: Secondary | ICD-10-CM

## 2019-08-13 DIAGNOSIS — R918 Other nonspecific abnormal finding of lung field: Secondary | ICD-10-CM

## 2019-08-13 HISTORY — DX: Nicotine dependence, unspecified, uncomplicated: F17.200

## 2019-08-13 HISTORY — DX: Chronic obstructive pulmonary disease, unspecified: J44.9

## 2019-08-13 HISTORY — DX: Other nonspecific abnormal finding of lung field: R91.8

## 2019-08-13 HISTORY — DX: Other long term (current) drug therapy: Z79.899

## 2019-08-13 HISTORY — DX: Other specified chronic obstructive pulmonary disease: J44.89

## 2019-08-13 LAB — CBC WITH DIFFERENTIAL/PLATELET
Basophils Absolute: 0 10*3/uL (ref 0.0–0.1)
Basophils Relative: 0.4 % (ref 0.0–3.0)
Eosinophils Absolute: 0.1 10*3/uL (ref 0.0–0.7)
Eosinophils Relative: 1.8 % (ref 0.0–5.0)
HCT: 42.7 % (ref 36.0–46.0)
Hemoglobin: 13.9 g/dL (ref 12.0–15.0)
Lymphocytes Relative: 28.2 % (ref 12.0–46.0)
Lymphs Abs: 1.9 10*3/uL (ref 0.7–4.0)
MCHC: 32.6 g/dL (ref 30.0–36.0)
MCV: 96.1 fl (ref 78.0–100.0)
Monocytes Absolute: 0.5 10*3/uL (ref 0.1–1.0)
Monocytes Relative: 6.8 % (ref 3.0–12.0)
Neutro Abs: 4.3 10*3/uL (ref 1.4–7.7)
Neutrophils Relative %: 62.8 % (ref 43.0–77.0)
Platelets: 277 10*3/uL (ref 150.0–400.0)
RBC: 4.44 Mil/uL (ref 3.87–5.11)
RDW: 13.5 % (ref 11.5–15.5)
WBC: 6.8 10*3/uL (ref 4.0–10.5)

## 2019-08-13 MED ORDER — VARENICLINE TARTRATE 0.5 MG X 11 & 1 MG X 42 PO MISC: ORAL | 0 refills | Status: DC

## 2019-08-13 NOTE — Patient Instructions (Addendum)
You were seen today by Lauraine Rinne, NP  for:   1. COPD with chronic bronchitis and emphysema (Standard)  Continue Dulera at this time  I will have the pharmacy team investigate what other inhalers may be affordable and we will likely switch this inhaler after I received this information  Note your daily symptoms > remember "red flags" for COPD:   >>>Increase in cough >>>increase in sputum production >>>increase in shortness of breath or activity  intolerance.   If you notice these symptoms, please call the office to be seen.   It is extremely important that you work to stop smoking as discussed today  You are due for Pneumovax 23 next year  2. Tobacco use disorder  - varenicline (CHANTIX PAK) 0.5 MG X 11 & 1 MG X 42 tablet; Take one 0.5 mg tablet by mouth once daily for 3 days, then increase to one 0.5 mg tablet twice daily for 4 days, then increase to one 1 mg tablet twice daily.  Dispense: 53 tablet; Refill: 0  We recommend that you stop smoking.  >>>You need to set a quit date >>>If you have friends or family who smoke, let them know you are trying to quit and not to smoke around you or in your living environment  Smoking Cessation Resources:  1 800 QUIT NOW  >>> Patient to call this resource and utilize it to help support her quit smoking >>> Keep up your hard work with stopping smoking  You can also contact the [facility] >>>For smoking cessation classes call [phone]  We do not recommend using e-cigarettes as a form of stopping smoking  You can sign up for smoking cessation support texts and information:  >>>https://smokefree.gov/smokefreetxt    3. Abnormal findings on diagnostic imaging of lung  We will repeat a lung cancer screening CT in April/2022 as scheduled  4. Medication management   I will have the pharmacy team investigate inhaler cost    Meds ordered this encounter  Medications  . varenicline (CHANTIX PAK) 0.5 MG X 11 & 1 MG X 42  tablet    Sig: Take one 0.5 mg tablet by mouth once daily for 3 days, then increase to one 0.5 mg tablet twice daily for 4 days, then increase to one 1 mg tablet twice daily.    Dispense:  53 tablet    Refill:  0    Follow Up:    Return in about 3 months (around 11/13/2019), or if symptoms worsen or fail to improve, for Follow up with Dr. Shearon Stalls.   Please do your part to reduce the spread of COVID-19:    Reduce your risk of any infection  and COVID19 by using the similar precautions used for avoiding the common cold or flu:  Marland Kitchen Wash your hands often with soap and warm water for at least 20 seconds.  If soap and water are not readily available, use an alcohol-based hand sanitizer with at least 60% alcohol.  . If coughing or sneezing, cover your mouth and nose by coughing or sneezing into the elbow areas of your shirt or coat, into a tissue or into your sleeve (not your hands). Langley Gauss A MASK when in public  . Avoid shaking hands with others and consider head nods or verbal greetings only. . Avoid touching your eyes, nose, or mouth with unwashed hands.  . Avoid close contact with people who are sick. . Avoid places or events with large numbers of people  in one location, like concerts or sporting events. . If you have some symptoms but not all symptoms, continue to monitor at home and seek medical attention if your symptoms worsen. . If you are having a medical emergency, call 911.   [place] / e-Visit: [email or web address]         [facility]: [facility]: [phone]                   [facility]: [phone]     It is flu season:   >>> Best ways to protect herself from the flu: Receive the yearly flu vaccine, practice good hand hygiene washing with soap and also using hand sanitizer when available, eat a nutritious meals, get  adequate rest, hydrate appropriately   Please contact the office if your symptoms worsen or you have concerns that you are not improving.   Thank you for choosing [facility] for your healthcare, and for allowing [place] to partner with you on your healthcare journey. I am thankful to be able to provide care to you today.   Wyn Quaker FNP-C      Health Risks of Smoking Smoking cigarettes is very bad for your health. Tobacco smoke has over 200 known poisons in it. It contains the poisonous gases nitrogen oxide and carbon monoxide. There are over 60 chemicals in tobacco smoke that cause cancer. Smoking is difficult to quit because a chemical in tobacco, called nicotine, causes addiction or dependence. When you smoke and inhale, nicotine is absorbed rapidly into the bloodstream through your lungs. Both inhaled and non-inhaled nicotine may be addictive. What are the risks of cigarette smoke? Cigarette smokers have an increased risk of many serious medical problems, including:  Lung cancer.  Lung disease, such as pneumonia, bronchitis, and emphysema.  Chest pain (angina) and heart attack because the heart is not getting enough oxygen.  Heart disease and peripheral blood vessel disease.  High blood pressure (hypertension).  Stroke.  Oral cancer, including cancer of the lip, mouth, or voice box.  Bladder cancer.  Pancreatic cancer.  Cervical cancer.  Pregnancy complications, including premature birth.  Stillbirths and smaller newborn babies, birth defects, and genetic damage to sperm.  Early menopause.  Lower estrogen level for women.  Infertility.  Facial wrinkles.  Blindness.  Increased risk of broken bones (fractures).  Senile dementia.  Stomach ulcers and internal bleeding.  Delayed wound healing and increased risk of complications during surgery.  Even smoking lightly shortens your life expectancy by several years. Because of secondhand smoke  exposure, children of smokers have an increased risk of the following:  Sudden infant death syndrome (SIDS).  Respiratory infections.  Lung cancer.  Heart disease.  Ear infections. What are the benefits of quitting? There are many health benefits of quitting smoking. Here are some of them:  Within days of quitting smoking, your risk of having a heart attack decreases, your blood flow improves, and your lung capacity improves. Blood pressure, pulse rate, and breathing patterns start returning to normal soon after quitting.  Within months, your lungs may clear up completely.  Quitting for 10 years reduces your risk of developing lung cancer and heart disease to almost that of a nonsmoker.  People who quit may see an improvement in their overall quality of life. How do I quit smoking?     Smoking is an addiction with both physical and psychological effects, and  longtime habits can be hard to change. Your health care provider can recommend:  Programs and community resources, which may include group support, education, or talk therapy.  Prescription medicines to help reduce cravings.  Nicotine replacement products, such as patches, gum, and nasal sprays. Use these products only as directed. Do not replace cigarette smoking with electronic cigarettes, which are commonly called e-cigarettes. The safety of e-cigarettes is not known, and some may contain harmful chemicals.  A combination of two or more of these methods. Where to find more information  American Lung Association: www.lung.org  American Cancer Society: www.cancer.org Summary  Smoking cigarettes is very bad for your health. Cigarette smokers have an increased risk of many serious medical problems, including several cancers, heart disease, and stroke.  Smoking is an addiction with both physical and psychological effects, and longtime habits can be hard to change.  By stopping right away, you can greatly reduce the risk  of medical problems for you and your family.  To help you quit smoking, your health care provider can recommend programs, community resources, prescription medicines, and nicotine replacement products such as patches, gum, and nasal sprays. This information is not intended to replace advice given to you by your health care provider. Make sure you discuss any questions you have with your health care provider. Document Revised: 05/23/2017 Document Reviewed: 02/24/2016 Elsevier Patient Education  2020 [employer].

## 2019-08-13 NOTE — Progress Notes (Signed)
@Patient  ID: Jola Baptist, female    DOB: 1956/01/07, 64 y.o.   MRN: [id number]  Chief Complaint  Patient presents with  . Follow-up    PFT yesterday, 4 cigerettes a day     Referring provider: Greig Right, MD  HPI:  64 year old female current everyday smoker followed in our office for COPD  PMH: Chronic pain syndrome, depression, anemia, anxiety, migraines, panic disorder, Smoker/ Smoking History: Current everyday smoker.  1 pack/day.  36-pack-year smoking history Maintenance:  Dulera Pt of: Dr. Shearon Stalls  08/13/2019  - Visit   64 year old female current everyday smoker followed in our office by Dr. Shearon Stalls.  Patient completed pulmonary function testing on 08/12/2019 she is here today to review those results.  See those results listed below:  08/12/2019-pulmonary function test-FVC 2.92 (99%), postbronchodilator ratio 67, postbronchodilator FEV1 2.07 (91% duty), no bronchodilator response, DLCO 15.27 (82% predicted) Patient did use Dulera before testing  Patient was last seen in our office in February/2021 for her initial consult with Dr. Shearon Stalls.  At that time the plan of care was for the patient to continue [facility].  Start Chantix in order to work to stop smoking.  She was referred to the lung cancer screening program and she was asked to obtain pulmonary function testing.  Lung cancer screening CT chest listed below:  06/08/2019-CT chest lung cancer screening-lung RADS 2, moderate centrilobular emphysema, aortic arthrosclerosis, gallstones  Patient reports that she was previously doing well on Chantix but her insurance stopped covering it due to her being on Chantix for too long.  She was then trialed on Wellbutrin.  She is not interested in continuing this today.  She would like to restart Chantix.  She is down to 5 to 6 cigarettes a day.  She understands that she needs to set a quit date.  She reports that her quit date is 09/03/2019.  Patient reports that she is maintained on Dulera  because this is the only inhaler that her insurance covers.  We will discuss this today.  Questionaires / Pulmonary Flowsheets:   ACT:  No flowsheet data found.  MMRC: mMRC Dyspnea Scale mMRC Score  04/29/2019 0    Epworth:  No flowsheet data found.  Tests:   FENO:  No results found for: NITRICOXIDE  PFT: PFT Results Latest Ref Rng & Units 08/12/2019  FVC-Pre L 2.92  FVC-Predicted Pre % 99  FVC-Post L 3.08  FVC-Predicted Post % 104  Pre FEV1/FVC % % 68  Post FEV1/FCV % % 67  FEV1-Pre L 1.98  FEV1-Predicted Pre % 87  FEV1-Post L 2.07  DLCO UNC% % 82  DLCO COR %Predicted % 70  TLC L 5.58  TLC % Predicted % 117  RV % Predicted % 124    WALK:  No flowsheet data found.  Imaging: No results found.  Lab Results:  CBC    Component Value Date/Time   WBC 14.0 (H) 01/15/2012 1239   RBC 4.69 01/15/2012 1239   HGB 14.1 01/15/2012 1239   HCT 41.7 01/15/2012 1239   PLT 204 01/15/2012 1239   MCV 88.9 01/15/2012 1239   MCH 30.1 01/15/2012 1239   MCHC 33.8 01/15/2012 1239   RDW 13.1 01/15/2012 1239   LYMPHSABS 0.6 (L) 01/15/2012 1239   MONOABS 0.8 01/15/2012 1239   EOSABS 0.0 01/15/2012 1239   BASOSABS 0.0 01/15/2012 1239    BMET    Component Value Date/Time   NA 138 01/15/2012 1239   K 3.6 01/15/2012 1239  CL 104 01/15/2012 1239   CO2 24 01/15/2012 1239   GLUCOSE 124 (H) 01/15/2012 1239   BUN 14 01/15/2012 1239   CREATININE 0.86 01/15/2012 1239   CALCIUM 9.3 01/15/2012 1239   GFRNONAA 74 (L) 01/15/2012 1239   GFRAA 85 (L) 01/15/2012 1239    BNP No results found for: BNP  ProBNP No results found for: PROBNP  Specialty Problems      Pulmonary Problems   Asthma   COPD (chronic obstructive pulmonary disease) (HCC)   COPD with chronic bronchitis and emphysema (HCC)      Allergies  Allergen Reactions  . Sulfa Antibiotics Itching and Hives  . Sulfacetamide Sodium Hives  . Tape Other (See Comments)    Pulls and irritates skin    Immunization  History  Administered Date(s) Administered  . Influenza-Unspecified 12/09/2014, 12/10/2018  . Pneumococcal Polysaccharide-23 07/13/2015  . Tdap 11/20/2013    Past Medical History:  Diagnosis Date  . Arthritis   . Depression   . GERD (gastroesophageal reflux disease)     Tobacco History: Social History   Tobacco Use  Smoking Status Current Every Day Smoker  . Packs/day: 1.00  . Years: 36.00  . Pack years: 36.00  . Types: Cigarettes  Smokeless Tobacco Never Used  Tobacco Comment   1/2 pack daily   Ready to quit: Not Answered Counseling given: Not Answered Comment: 1/2 pack daily  Smoking assessment and cessation counseling  Patient currently smoking: 5 - 6 cigarettes  I have advised the patient to quit/stop smoking as soon as possible due to high risk for multiple medical problems.  It will also be very difficult for [place] to manage patient's  respiratory symptoms and status if we continue to expose her lungs to a known irritant.  We do not advise e-cigarettes as a form of stopping smoking.  Patient is willing to quit smoking. Set quit date 09/03/19.   I have advised the patient that we can assist and have options of nicotine replacement therapy, provided smoking cessation education today, provided smoking cessation counseling, and provided cessation resources.  Follow-up next office visit office visit for assessment of smoking cessation.  Smoking cessation counseling advised for: 11 min    Outpatient Encounter Medications as of 08/13/2019  Medication Sig  . ALPRAZolam (XANAX) 0.5 MG tablet Take 1 tablet by mouth 2 (two) times a day.  Marland Kitchen atorvastatin (LIPITOR) 40 MG tablet Take 1 tablet (40 mg total) by mouth every evening. Fir high Cholesterol/fat  . buPROPion (WELLBUTRIN SR) 150 MG 12 hr tablet Take 1 tablet (150 mg total) by mouth 2 (two) times daily.  . citalopram (CELEXA) 40 MG tablet Take by mouth.  . diclofenac (CATAFLAM) 50 MG tablet Take 1 tablet by mouth daily.  .  diphenoxylate-atropine (LOMOTIL) 2.5-0.025 MG tablet Take by mouth 4 (four) times daily as needed for diarrhea or loose stools.  . DULoxetine (CYMBALTA) 60 MG capsule Take 1 capsule by mouth daily.  Marland Kitchen esomeprazole (NEXIUM) 40 MG capsule Take 40 mg by mouth daily at 12 noon.  . fluticasone (FLONASE) 50 MCG/ACT nasal spray Place 2 sprays into both nostrils every evening. For allergies  . gabapentin (NEURONTIN) 300 MG capsule Take 1 capsule by mouth daily.  Marland Kitchen ibuprofen (ADVIL) 800 MG tablet Take 1 tablet (800 mg total) by mouth 3 (three) times daily.  . mirtazapine (REMERON) 15 MG tablet Take 1 tablet by mouth daily.  . mometasone-formoterol (DULERA) 100-5 MCG/ACT AERO Inhale 2 puffs into the lungs 2 (two)  times daily.  Marland Kitchen PROAIR HFA 108 (90 Base) MCG/ACT inhaler INHALE 2 PUFFS BY MOUTH EVERY 4 TO 6 HOURS AS NEEDED  . topiramate (TOPAMAX) 50 MG tablet Take 1 tablet (50 mg total) by mouth daily. For mood stabilization  . [DISCONTINUED] azithromycin (ZITHROMAX) 250 MG tablet 500mg  (two tablets) today, then 250mg  (1 tablet) for the next 4 days  . [DISCONTINUED] CHANTIX CONTINUING MONTH PAK 1 MG tablet FOLLOW PACKAGE DIRECTIONS  . [DISCONTINUED] clonazePAM (KLONOPIN) 1 MG tablet Take 1 tablet (1 mg total) by mouth daily as needed (for panic attacks). For panic attacks  . [DISCONTINUED] predniSONE (DELTASONE) 10 MG tablet 4 tabs for 2 days, then 3 tabs for 2 days, 2 tabs for 2 days, then 1 tab for 2 days, then stop  . [DISCONTINUED] varenicline (CHANTIX STARTING MONTH PAK) 0.5 MG X 11 & 1 MG X 42 tablet FOLLOW PACKAGE DIRECTIONS  . varenicline (CHANTIX PAK) 0.5 MG X 11 & 1 MG X 42 tablet Take one 0.5 mg tablet by mouth once daily for 3 days, then increase to one 0.5 mg tablet twice daily for 4 days, then increase to one 1 mg tablet twice daily.   No facility-administered encounter medications on file as of 08/13/2019.     Review of Systems  Review of Systems  Constitutional: Negative for activity  change, fatigue and fever.  HENT: Negative for sinus pressure, sinus pain and sore throat.   Respiratory: Positive for cough. Negative for shortness of breath and wheezing.   Cardiovascular: Negative for chest pain and palpitations.  Gastrointestinal: Negative for diarrhea, nausea and vomiting.  Musculoskeletal: Negative for arthralgias.  Neurological: Negative for dizziness.  Psychiatric/Behavioral: Negative for sleep disturbance. The patient is not nervous/anxious.      Physical Exam  BP 130/78 (BP Location: Left Arm, Cuff Size: Normal)   Pulse 88   Temp 98.3 F (36.8 C) (Oral)   Ht 5\' 2"  (1.575 m)   Wt 169 lb (76.7 kg)   LMP 12/04/2011   SpO2 95%   BMI 30.91 kg/m   Wt Readings from Last 5 Encounters:  08/13/19 169 lb (76.7 kg)  05/14/19 170 lb (77.1 kg)  04/29/19 178 lb (80.7 kg)  10/07/18 181 lb 1.9 oz (82.2 kg)  09/29/18 174 lb (78.9 kg)    BMI Readings from Last 5 Encounters:  08/13/19 30.91 kg/m  05/14/19 31.09 kg/m  04/29/19 32.56 kg/m  10/07/18 33.13 kg/m  09/29/18 31.83 kg/m     Physical Exam Vitals and nursing note reviewed.  Constitutional:      General: She is not in acute distress.    Appearance: Normal appearance. She is normal weight.  HENT:     Head: Normocephalic and atraumatic.     Right Ear: Tympanic membrane, ear canal and external ear normal. There is no impacted cerumen.     Left Ear: Tympanic membrane, ear canal and external ear normal. There is impacted cerumen.     Nose: Nose normal. No congestion.     Mouth/Throat:     Mouth: Mucous membranes are moist.     Pharynx: Oropharynx is clear.  Eyes:     Pupils: Pupils are equal, round, and reactive to light.  Cardiovascular:     Rate and Rhythm: Normal rate and regular rhythm.     Pulses: Normal pulses.     Heart sounds: Normal heart sounds. No murmur heard.   Pulmonary:     Effort: Pulmonary effort is normal. No respiratory distress.  Breath sounds: No decreased air movement.  No decreased breath sounds, wheezing or rales.  Musculoskeletal:     Cervical back: Normal range of motion.  Skin:    General: Skin is warm and dry.     Capillary Refill: Capillary refill takes less than 2 seconds.  Neurological:     General: No focal deficit present.     Mental Status: She is alert and oriented to person, place, and time. Mental status is at baseline.     Gait: Gait normal.  Psychiatric:        Mood and Affect: Mood normal.        Behavior: Behavior normal.        Thought Content: Thought content normal.        Judgment: Judgment normal.       Assessment & Plan:   COPD with chronic bronchitis and emphysema ([place]) Reviewed pulmonary function testing today with patient Patient is a current smoker COPD Gold stage I  Plan: Continue Dulera We will have pharmacy investigate cost options of other inhalers.  Would prefer patient on LAMA/LABA inhaler We will repeat lab work today to assess for eosinophil count Emphasized need to stop smoking Start Chantix Follow-up with our office in 3 months  Tobacco use disorder Current smoker 36-pack-year smoking history Would like to restart Chantix Has set quit date for 09/03/2019 April/21 lung cancer screening CT read as lung RADS 2  Plan: Emphasized importance of stopping smoking Restart Chantix Okay to stop Wellbutrin Repeat lung cancer screening CT in April/2022  Abnormal findings on diagnostic imaging of lung Repeat lung cancer screening CT in April/2022  Medication management Plan: We will refer to pharmacy team to investigate cost of other inhalers Repeating CBC with differential today to assess eosinophil count If eosinophils are low would prefer LAMA/LABA inhaler    Return in about 3 months (around 11/13/2019), or if symptoms worsen or fail to improve, for Follow up with Dr. Shearon Stalls.   Lauraine Rinne, NP 08/13/2019   This appointment required 34 minutes of patient care (this includes precharting, chart  review, review of results, face-to-face care, etc.).

## 2019-08-13 NOTE — Telephone Encounter (Signed)
For LAMA/LABA- Medicaid will pay for Anoro, [employer].   For ICS/LABA- Medicaid will pay for Advair Diskus/HFA, Dulera, and Symbicort.   Once patient's Medicare is active, it will become primary and will have it's own formulary.  1:54 PM  Beatriz Chancellor, CPhT

## 2019-08-13 NOTE — Assessment & Plan Note (Signed)
Current smoker 36-pack-year smoking history Would like to restart Chantix Has set quit date for 09/03/2019 April/21 lung cancer screening CT read as lung RADS 2  Plan: Emphasized importance of stopping smoking Restart Chantix Okay to stop Wellbutrin Repeat lung cancer screening CT in April/2022

## 2019-08-13 NOTE — Telephone Encounter (Signed)
08/13/2019  Patient is currently maintained on [place].  I am repeating a CBC with differential to assess eosinophil counts.  If this is still low would recommend switching to LAMA/LABA inhaler.  Patient reports that her insurance only covers "Dulera".  I believe the patient is likely mistaken.  Patient is currently maintained on Medicaid.  She reports that she will start Medicare coverage in July/2021.  Can we investigate cost options for a LAMA/LABA inhalers for the patient.  Based off lab work can decide if we would like to transition the patient off of [facility]  She is followed by Dr. Shearon Stalls.  Wyn Quaker, FNP

## 2019-08-13 NOTE — Assessment & Plan Note (Signed)
Plan: We will refer to pharmacy team to investigate cost of other inhalers Repeating CBC with differential today to assess eosinophil count If eosinophils are low would prefer LAMA/LABA inhaler

## 2019-08-13 NOTE — Assessment & Plan Note (Signed)
Reviewed pulmonary function testing today with patient Patient is a current smoker COPD Gold stage I  Plan: Continue Dulera We will have pharmacy investigate cost options of other inhalers.  Would prefer patient on LAMA/LABA inhaler We will repeat lab work today to assess for eosinophil count Emphasized need to stop smoking Start Chantix Follow-up with our office in 3 months

## 2019-08-13 NOTE — Assessment & Plan Note (Signed)
Repeat lung cancer screening CT in April/2022

## 2019-08-25 MED ORDER — ANORO ELLIPTA 62.5-25 MCG/INH IN AEPB: 1.0000 | INHALATION_SPRAY | Freq: Every day | RESPIRATORY_TRACT | 0 refills | Status: DC

## 2019-08-25 NOTE — Telephone Encounter (Signed)
Triage,  Please contact the patient and let her know that the eosinophil counts are still low.  I would recommend transitioning from her current inhaler of Dulera to either Anoro Ellipta or Stiolto Respimat.  We can provide her samples.  Either:   Anoro Ellipta  >>> Take 1 puff daily in the morning right when you wake up >>>Rinse your mouth out after use >>>This is a daily maintenance inhaler, NOT a rescue inhaler >>>Contact our office if you are having difficulties affording or obtaining this medication >>>It is important for you to be able to take this daily and not miss any doses   Or   Stiolto Respimat inhaler >>>2 puffs daily >>>Take this no matter what >>>This is not a rescue inhaler  And stop Dulera when starting 1 of these inhalers  Please also inform her that these are covered by her Medicaid.  Once she receives Medicare that would become primary and that would have a separate formulary.  If she would prefer she can wait for a sample inhaler trial until after she establishes with Medicare and she can notify our office and we can do a further investigation with our pharmacy team at that time.  Wyn Quaker, FNP

## 2019-08-25 NOTE — Telephone Encounter (Signed)
I spoke with pt and agreed to try Anoro. I left two samples up front for pt to pick up. Nothing furthe ris needed.

## 2019-08-25 NOTE — Telephone Encounter (Signed)
ATC pt, there was no answer and no option to leave a message. Will try back. 

## 2019-09-03 ENCOUNTER — Telehealth: Payer: Self-pay | Admitting: Pulmonary Disease

## 2019-09-03 DIAGNOSIS — J449 Chronic obstructive pulmonary disease, unspecified: Secondary | ICD-10-CM

## 2019-09-03 MED ORDER — ANORO ELLIPTA 62.5-25 MCG/INH IN AEPB: 1.0000 | INHALATION_SPRAY | Freq: Every day | RESPIRATORY_TRACT | 5 refills | Status: DC

## 2019-09-03 MED ORDER — PROAIR HFA 108 (90 BASE) MCG/ACT IN AERS: INHALATION_SPRAY | RESPIRATORY_TRACT | 3 refills | Status: DC

## 2019-09-03 NOTE — Telephone Encounter (Signed)
Patient notified that refills for Anoro and ProAir sent to preferred pharmacy.

## 2019-10-26 ENCOUNTER — Ambulatory Visit: Payer: Medicare Other | Admitting: Psychology

## 2019-10-27 ENCOUNTER — Ambulatory Visit (INDEPENDENT_AMBULATORY_CARE_PROVIDER_SITE_OTHER): Payer: Medicare Other | Admitting: Psychology

## 2019-10-27 DIAGNOSIS — F331 Major depressive disorder, recurrent, moderate: Secondary | ICD-10-CM | POA: Diagnosis not present

## 2019-11-02 DIAGNOSIS — Z9089 Acquired absence of other organs: Secondary | ICD-10-CM | POA: Insufficient documentation

## 2019-11-02 DIAGNOSIS — Z9889 Other specified postprocedural states: Secondary | ICD-10-CM

## 2019-11-02 HISTORY — DX: Other specified postprocedural states: Z98.890

## 2019-11-02 HISTORY — DX: Acquired absence of other organs: Z90.89

## 2019-11-03 DIAGNOSIS — L608 Other nail disorders: Secondary | ICD-10-CM

## 2019-11-03 HISTORY — DX: Other nail disorders: L60.8

## 2019-11-10 ENCOUNTER — Ambulatory Visit (INDEPENDENT_AMBULATORY_CARE_PROVIDER_SITE_OTHER): Payer: Medicare Other | Admitting: Psychology

## 2019-11-10 DIAGNOSIS — F331 Major depressive disorder, recurrent, moderate: Secondary | ICD-10-CM | POA: Diagnosis not present

## 2019-11-12 DIAGNOSIS — H95192 Other disorders following mastoidectomy, left ear: Secondary | ICD-10-CM

## 2019-11-12 HISTORY — DX: Other disorders following mastoidectomy, left ear: H95.192

## 2019-11-16 ENCOUNTER — Encounter: Payer: Self-pay | Admitting: Internal Medicine

## 2019-11-16 ENCOUNTER — Other Ambulatory Visit: Payer: Self-pay

## 2019-11-16 ENCOUNTER — Ambulatory Visit (INDEPENDENT_AMBULATORY_CARE_PROVIDER_SITE_OTHER): Payer: Medicare Other | Admitting: Internal Medicine

## 2019-11-16 VITALS — BP 130/70 | HR 92 | Temp 98.3°F | Ht 62.0 in | Wt 167.8 lb

## 2019-11-16 DIAGNOSIS — J449 Chronic obstructive pulmonary disease, unspecified: Secondary | ICD-10-CM

## 2019-11-16 DIAGNOSIS — Z716 Tobacco abuse counseling: Secondary | ICD-10-CM

## 2019-11-16 DIAGNOSIS — M810 Age-related osteoporosis without current pathological fracture: Secondary | ICD-10-CM | POA: Insufficient documentation

## 2019-11-16 HISTORY — DX: Age-related osteoporosis without current pathological fracture: M81.0

## 2019-11-16 HISTORY — DX: Chronic obstructive pulmonary disease, unspecified: J44.9

## 2019-11-16 MED ORDER — FLUTICASONE PROPIONATE 50 MCG/ACT NA SUSP: 2.0000 | Freq: Every evening | NASAL | 5 refills | Status: DC

## 2019-11-16 MED ORDER — BUPROPION HCL ER (SR) 150 MG PO TB12: 150.0000 mg | ORAL_TABLET | Freq: Two times a day (BID) | ORAL | 5 refills | Status: DC

## 2019-11-16 MED ORDER — PROAIR HFA 108 (90 BASE) MCG/ACT IN AERS: INHALATION_SPRAY | RESPIRATORY_TRACT | 5 refills | Status: DC

## 2019-11-16 NOTE — Patient Instructions (Addendum)
The patient should have follow up scheduled with myself in 6 months.   

## 2019-11-16 NOTE — Progress Notes (Signed)
Shirley Williams    [id number]    12/01/1955  Primary Care Physician:Uppin, Shirley Bon, MD Date of Appointment: 11/16/2019 Established Patient Visit  Chief complaint:   Chief Complaint  Patient presents with  . Follow-up    Anoro gave her horrible thrush. Feels good overall      HPI: Shirley Williams is a 64 year old woman with a history of active tobacco use disorder who presents with new patient evaluation for COPD.  Diagnosed with COPD by PCP 2015.  Had quit smoking with chantix before for 7-8 months, but started back up again in 2020 because of stress. Has GERD on nexium. Barrett's Esophagus.   Interval Updates: Was on chantix previously for smoking cessation. Switched to wellbutrin when chantix was taken off the market. In the mean time was also seen by Aaron Edelman NP and got PFTs for lung cancer screening.  Switched to [employer] due to cost and it gave her thrush.  Feeling okay off it. Albuterol use is 1-2 times/month. Feels Wellbutrin is helping her lose weight. No adverse effects. Down on her smoking but doesn't feel this is the wellbutrin, felt chantix helped more.   I have reviewed the patient's family social and past medical history and updated as appropriate.   Past Medical History:  Diagnosis Date  . Arthritis   . Depression   . GERD (gastroesophageal reflux disease)     Past Surgical History:  Procedure Laterality Date  . APPENDECTOMY    . LAPAROSCOPIC APPENDECTOMY  01/15/2012   Procedure: APPENDECTOMY LAPAROSCOPIC;  Surgeon: Imogene Burn. Georgette Dover, MD;  Location: [facility] OR;  Service: General;  Laterality: N/A;  . TONSILLECTOMY      Family History  Problem Relation Age of Onset  . COPD Mother   . Cancer Father        brain  . Asthma Sister   . Depression Sister     Social History   Occupational History  . Not on file  Tobacco Use  . Smoking status: Current Every Day Smoker    Packs/day: 0.25    Years: 36.00    Pack years: 9.00    Types: Cigarettes  . Smokeless  tobacco: Never Used  . Tobacco comment: 1/2 pack daily  Vaping Use  . Vaping Use: Never used  Substance and Sexual Activity  . Alcohol use: Yes    Comment: occ  . Drug use: No  . Sexual activity: Yes    Birth control/protection: Post-menopausal     Physical Exam: Blood pressure 130/70, pulse 92, temperature 98.3 F (36.8 C), temperature source Temporal, height 5\' 2"  (1.575 m), weight 167 lb 12.8 oz (76.1 kg), last menstrual period 12/04/2011, SpO2 95 %.  Gen:      No acute distress ENT:      Mmm no thrush Lungs:    No increased respiratory effort, symmetric chest wall excursion, clear to auscultation bilaterally, no wheezes or crackles CV:         Regular rate and rhythm; no murmurs, rubs, or gallops.  No pedal edema   Data Reviewed: Imaging: I have personally reviewed the CT Chest April 2021 which shows centrilobular emphysema.  PFTs:  PFT Results Latest Ref Rng & Units 08/12/2019  FVC-Pre L 2.92  FVC-Predicted Pre % 99  FVC-Post L 3.08  FVC-Predicted Post % 104  Pre FEV1/FVC % % 68  Post FEV1/FCV % % 67  FEV1-Pre L 1.98  FEV1-Predicted Pre % 87  FEV1-Post L 2.07  DLCO  uncorrected ml/min/mmHg 15.27  DLCO UNC% % 82  DLCO corrected ml/min/mmHg 15.27  DLCO COR %Predicted % 82  DLVA Predicted % 70  TLC L 5.58  TLC % Predicted % 117  RV % Predicted % 124   I have personally reviewed the patient's PFTs and spirometry shows mild airflow limitation without a bronchodilator response.  Labs: Lab Results  Component Value Date   WBC 6.8 08/13/2019   HGB 13.9 08/13/2019   HCT 42.7 08/13/2019   MCV 96.1 08/13/2019   PLT 277.0 08/13/2019    Immunization status: Immunization History  Administered Date(s) Administered  . Influenza-Unspecified 12/09/2014, 12/10/2018  . PFIZER SARS-COV-2 Vaccination 05/31/2019, 06/21/2019  . Pneumococcal Polysaccharide-23 07/13/2015  . Tdap 11/20/2013    Assessment:  COPD/Emphysema Tobacco Use Disorder Allergic  Rhinitis  Plan/Recommendations: Continue albtuerol Continue wellbutrin Continue flonase  She would like to go back on chantix when it is available again.  Return to Care: Return in about 6 months (around 05/15/2020).  Lenice Llamas, MD [facility] [place]

## 2019-11-24 ENCOUNTER — Ambulatory Visit (INDEPENDENT_AMBULATORY_CARE_PROVIDER_SITE_OTHER): Payer: Medicare Other | Admitting: Psychology

## 2019-11-24 DIAGNOSIS — F331 Major depressive disorder, recurrent, moderate: Secondary | ICD-10-CM

## 2019-12-09 ENCOUNTER — Ambulatory Visit (INDEPENDENT_AMBULATORY_CARE_PROVIDER_SITE_OTHER): Payer: Medicare Other | Admitting: Psychology

## 2019-12-09 DIAGNOSIS — F331 Major depressive disorder, recurrent, moderate: Secondary | ICD-10-CM

## 2019-12-23 ENCOUNTER — Ambulatory Visit (INDEPENDENT_AMBULATORY_CARE_PROVIDER_SITE_OTHER): Payer: Medicare Other | Admitting: Psychology

## 2019-12-23 DIAGNOSIS — F331 Major depressive disorder, recurrent, moderate: Secondary | ICD-10-CM | POA: Diagnosis not present

## 2020-01-11 ENCOUNTER — Other Ambulatory Visit: Payer: Self-pay | Admitting: Internal Medicine

## 2020-01-13 ENCOUNTER — Ambulatory Visit: Payer: Medicare Other | Admitting: Psychology

## 2020-01-20 DIAGNOSIS — H7420 Discontinuity and dislocation of ear ossicles, unspecified ear: Secondary | ICD-10-CM

## 2020-01-20 HISTORY — DX: Discontinuity and dislocation of ear ossicles, unspecified ear: H74.20

## 2020-01-25 ENCOUNTER — Ambulatory Visit (INDEPENDENT_AMBULATORY_CARE_PROVIDER_SITE_OTHER): Payer: Medicare Other | Admitting: Psychology

## 2020-01-25 DIAGNOSIS — F331 Major depressive disorder, recurrent, moderate: Secondary | ICD-10-CM | POA: Diagnosis not present

## 2020-03-08 DIAGNOSIS — N952 Postmenopausal atrophic vaginitis: Secondary | ICD-10-CM

## 2020-03-08 HISTORY — DX: Postmenopausal atrophic vaginitis: N95.2

## 2020-03-16 ENCOUNTER — Ambulatory Visit: Payer: Medicare HMO | Admitting: Psychology

## 2020-03-22 ENCOUNTER — Ambulatory Visit (INDEPENDENT_AMBULATORY_CARE_PROVIDER_SITE_OTHER): Payer: Medicare HMO | Admitting: Psychology

## 2020-03-22 DIAGNOSIS — F331 Major depressive disorder, recurrent, moderate: Secondary | ICD-10-CM

## 2020-04-06 ENCOUNTER — Ambulatory Visit (INDEPENDENT_AMBULATORY_CARE_PROVIDER_SITE_OTHER): Payer: Medicare HMO | Admitting: Psychology

## 2020-04-06 DIAGNOSIS — F331 Major depressive disorder, recurrent, moderate: Secondary | ICD-10-CM | POA: Diagnosis not present

## 2020-04-18 ENCOUNTER — Ambulatory Visit (INDEPENDENT_AMBULATORY_CARE_PROVIDER_SITE_OTHER): Payer: Medicare HMO | Admitting: Psychology

## 2020-04-18 DIAGNOSIS — F331 Major depressive disorder, recurrent, moderate: Secondary | ICD-10-CM | POA: Diagnosis not present

## 2020-05-10 ENCOUNTER — Ambulatory Visit: Payer: Medicare HMO | Admitting: Psychology

## 2020-05-18 ENCOUNTER — Encounter: Payer: Self-pay | Admitting: Internal Medicine

## 2020-05-18 ENCOUNTER — Other Ambulatory Visit: Payer: Self-pay

## 2020-05-18 ENCOUNTER — Ambulatory Visit (INDEPENDENT_AMBULATORY_CARE_PROVIDER_SITE_OTHER): Payer: Medicare Other | Admitting: Internal Medicine

## 2020-05-18 VITALS — BP 144/82 | HR 90 | Temp 97.7°F | Ht 62.5 in | Wt 170.4 lb

## 2020-05-18 DIAGNOSIS — J42 Unspecified chronic bronchitis: Secondary | ICD-10-CM | POA: Diagnosis not present

## 2020-05-18 DIAGNOSIS — J441 Chronic obstructive pulmonary disease with (acute) exacerbation: Secondary | ICD-10-CM | POA: Diagnosis not present

## 2020-05-18 DIAGNOSIS — Z716 Tobacco abuse counseling: Secondary | ICD-10-CM | POA: Diagnosis not present

## 2020-05-18 MED ORDER — VARENICLINE TARTRATE 1 MG PO TABS: 1.0000 mg | ORAL_TABLET | Freq: Two times a day (BID) | ORAL | 3 refills | Status: DC

## 2020-05-18 MED ORDER — VARENICLINE TARTRATE 0.5 MG X 11 & 1 MG X 42 PO MISC
ORAL | 0 refills | Status: DC
Start: 2020-05-18 — End: 2020-08-12

## 2020-05-18 NOTE — Patient Instructions (Addendum)
The patient should have follow up scheduled with myself in 6 months.   Prior to next visit patient should have: Spirometry  I am starting you on chantix to help you quit smoking.   Varenicline - Varenicline (brand name: Chantix) is a prescription medication that works in the brain to reduce nicotine withdrawal symptoms and cigarette cravings. In several studies, it was more effective than placebo (a lookalike substitute that contains no medication.)  It should be taken after eating with a full glass of water as follows: ?One 0.5 mg tablet daily for three days ?One 0.5 mg tablet twice daily for the next four days ?One 1.0 mg tablet twice daily starting at day 7  You should plan to quit smoking between one and four weeks after starting varenicline.   You should continue it for 12 weeks before concluding if it is working; if you successfully quit at 12 weeks, you may continue taking it for an additional 12 weeks. If you have not quit after taking varenicline for 12 weeks, talk to your health care provider about the next step. Options include working harder to make behavioral changes and continuing varenicline or switching to another treatment.  Common side effects of varenicline include nausea and abnormal dreams.  In the very early years of Chantix there were some concerns about Chantix affecting mood and depression.  However, there has been lots of research on this and this is no longer a concern.  Chantix is considered safe to use even in patients taking medication for depression.   In 2011, the [place] Food and Drug Administration (FDA) issued an advisory that, in people who already have heart or blood vessel disease, varenicline may increase the risk of acute heart problems. If there is such a risk, it appears to be small, and is likely outweighed by the benefits of quitting smoking.

## 2020-05-18 NOTE — Progress Notes (Signed)
Shirley Williams    [id number]    1955-03-22  Primary Care Physician:Uppin, Gae Bon, MD Date of Appointment: 05/18/2020 Established Patient Visit  Chief complaint:   Chief Complaint  Patient presents with  . Follow-up    No c/o     HPI: Shirley Williams is a 65 year old woman with a history of active tobacco use disorder who presents with new patient evaluation for COPD.  Diagnosed with COPD by PCP 2015.  Had quit smoking with chantix before for 7-8 months, but started back up again in 2020 because of stress. Has GERD on nexium. Barrett's Esophagus.   Interval Updates:  Here for follow up for COPD. She is down down to 1-2 cigarettes a week. needing albuterol once a week at the most.  Rhinitis is controlled - usually gets worse in the spring. No hospitalizations or ED visits for breathing. No copd exacerbations.  She recently had all of her teeth replaced and feels much improved.   I have reviewed the patient's family social and past medical history and updated as appropriate.   Past Medical History:  Diagnosis Date  . Arthritis   . Depression   . GERD (gastroesophageal reflux disease)     Past Surgical History:  Procedure Laterality Date  . APPENDECTOMY    . LAPAROSCOPIC APPENDECTOMY  01/15/2012   Procedure: APPENDECTOMY LAPAROSCOPIC;  Surgeon: Imogene Burn. Georgette Dover, MD;  Location: [facility];  Service: General;  Laterality: N/A;  . TONSILLECTOMY      Family History  Problem Relation Age of Onset  . COPD Mother   . Cancer Father        brain  . Asthma Sister   . Depression Sister     Social History   Occupational History  . Not on file  Tobacco Use  . Smoking status: Current Every Day Smoker    Packs/day: 0.25    Years: 36.00    Pack years: 9.00    Types: Cigarettes  . Smokeless tobacco: Never Used  . Tobacco comment: 1-2 cigarettes smoked weekly 05/18/20 ARJ   Vaping Use  . Vaping Use: Never used  Substance and Sexual Activity  . Alcohol use: Yes    Comment:  occ  . Drug use: No  . Sexual activity: Yes    Birth control/protection: Post-menopausal     Physical Exam: Blood pressure (!) 144/82, pulse 90, temperature 97.7 F (36.5 C), temperature source Temporal, height 5' 2.5" (1.588 m), weight 170 lb 6.4 oz (77.3 kg), last menstrual period 12/04/2011, SpO2 99 %.  Gen:      No acute distress ENT:      Mmm no thrush Lungs:    No increased respiratory effort, symmetric chest wall excursion, clear to auscultation bilaterally, no wheezes or crackles CV:         Regular rate and rhythm; no murmurs, rubs, or gallops.  No pedal edema   Data Reviewed: Imaging: I have personally reviewed the CT Chest April 2021 which shows centrilobular emphysema.  PFTs:  PFT Results Latest Ref Rng & Units 08/12/2019  FVC-Pre L 2.92  FVC-Predicted Pre % 99  FVC-Post L 3.08  FVC-Predicted Post % 104  Pre FEV1/FVC % % 68  Post FEV1/FCV % % 67  FEV1-Pre L 1.98  FEV1-Predicted Pre % 87  FEV1-Post L 2.07  DLCO uncorrected ml/min/mmHg 15.27  DLCO UNC% % 82  DLCO corrected ml/min/mmHg 15.27  DLCO COR %Predicted % 82  DLVA Predicted % 70  TLC L 5.58  TLC % Predicted % 117  RV % Predicted % 124   I have personally reviewed the patient's PFTs and spirometry shows mild airflow limitation without a bronchodilator response.  Labs: Lab Results  Component Value Date   WBC 6.8 08/13/2019   HGB 13.9 08/13/2019   HCT 42.7 08/13/2019   MCV 96.1 08/13/2019   PLT 277.0 08/13/2019    Immunization status: Immunization History  Administered Date(s) Administered  . Influenza-Unspecified 12/09/2014, 12/10/2018  . PFIZER(Purple Top)SARS-COV-2 Vaccination 05/31/2019, 06/21/2019, 03/24/2020  . Pneumococcal Polysaccharide-23 07/13/2015  . Tdap 11/20/2013  . Unspecified SARS-COV-2 Vaccination 05/31/2019, 06/21/2019    Assessment:  COPD/Emphysema, controlled Tobacco Use Disorder, trying to quit Allergic Rhinitis, controlled  Plan/Recommendations: Continue  albuterol Continue wellbutrin. Will start chantix Continue flonase   Return to Care: Return in about 6 months (around 11/18/2020).  Lenice Llamas, MD [facility] [place]

## 2020-06-03 ENCOUNTER — Other Ambulatory Visit: Payer: Self-pay

## 2020-06-03 DIAGNOSIS — H90A12 Conductive hearing loss, unilateral, left ear with restricted hearing on the contralateral side: Secondary | ICD-10-CM

## 2020-06-03 DIAGNOSIS — K219 Gastro-esophageal reflux disease without esophagitis: Secondary | ICD-10-CM | POA: Insufficient documentation

## 2020-06-03 HISTORY — DX: Conductive hearing loss, unilateral, left ear with restricted hearing on the contralateral side: H90.A12

## 2020-06-06 ENCOUNTER — Ambulatory Visit: Payer: Medicare Other | Admitting: Cardiology

## 2020-06-08 ENCOUNTER — Ambulatory Visit
Admission: RE | Admit: 2020-06-08 | Discharge: 2020-06-08 | Disposition: A | Discharge status: Home / Self Care | Payer: Medicare Other | Source: Ambulatory Visit | Attending: Acute Care | Admitting: Acute Care

## 2020-06-08 DIAGNOSIS — F17210 Nicotine dependence, cigarettes, uncomplicated: Secondary | ICD-10-CM | POA: Diagnosis not present

## 2020-06-08 DIAGNOSIS — Z87891 Personal history of nicotine dependence: Secondary | ICD-10-CM

## 2020-06-27 NOTE — Progress Notes (Signed)

## 2020-06-28 ENCOUNTER — Other Ambulatory Visit: Payer: Self-pay | Admitting: *Deleted

## 2020-06-28 DIAGNOSIS — F17210 Nicotine dependence, cigarettes, uncomplicated: Secondary | ICD-10-CM

## 2020-06-28 DIAGNOSIS — Z87891 Personal history of nicotine dependence: Secondary | ICD-10-CM

## 2020-07-07 DIAGNOSIS — Z9889 Other specified postprocedural states: Secondary | ICD-10-CM | POA: Diagnosis not present

## 2020-07-07 DIAGNOSIS — H8111 Benign paroxysmal vertigo, right ear: Secondary | ICD-10-CM | POA: Diagnosis not present

## 2020-07-07 DIAGNOSIS — J3489 Other specified disorders of nose and nasal sinuses: Secondary | ICD-10-CM | POA: Diagnosis not present

## 2020-07-07 DIAGNOSIS — Z8669 Personal history of other diseases of the nervous system and sense organs: Secondary | ICD-10-CM | POA: Diagnosis not present

## 2020-07-07 DIAGNOSIS — R42 Dizziness and giddiness: Secondary | ICD-10-CM

## 2020-07-07 DIAGNOSIS — F17210 Nicotine dependence, cigarettes, uncomplicated: Secondary | ICD-10-CM | POA: Diagnosis not present

## 2020-07-07 DIAGNOSIS — H938X3 Other specified disorders of ear, bilateral: Secondary | ICD-10-CM | POA: Diagnosis not present

## 2020-07-07 HISTORY — DX: Dizziness and giddiness: R42

## 2020-07-14 DIAGNOSIS — J449 Chronic obstructive pulmonary disease, unspecified: Secondary | ICD-10-CM | POA: Diagnosis not present

## 2020-07-14 DIAGNOSIS — I10 Essential (primary) hypertension: Secondary | ICD-10-CM | POA: Diagnosis not present

## 2020-07-14 DIAGNOSIS — E559 Vitamin D deficiency, unspecified: Secondary | ICD-10-CM | POA: Diagnosis not present

## 2020-07-14 DIAGNOSIS — E782 Mixed hyperlipidemia: Secondary | ICD-10-CM | POA: Diagnosis not present

## 2020-07-14 DIAGNOSIS — M1990 Unspecified osteoarthritis, unspecified site: Secondary | ICD-10-CM | POA: Diagnosis not present

## 2020-07-14 DIAGNOSIS — F32A Depression, unspecified: Secondary | ICD-10-CM | POA: Diagnosis not present

## 2020-07-14 DIAGNOSIS — Z8719 Personal history of other diseases of the digestive system: Secondary | ICD-10-CM | POA: Diagnosis not present

## 2020-07-14 DIAGNOSIS — F17200 Nicotine dependence, unspecified, uncomplicated: Secondary | ICD-10-CM | POA: Diagnosis not present

## 2020-07-14 DIAGNOSIS — K219 Gastro-esophageal reflux disease without esophagitis: Secondary | ICD-10-CM | POA: Diagnosis not present

## 2020-07-14 DIAGNOSIS — J309 Allergic rhinitis, unspecified: Secondary | ICD-10-CM | POA: Diagnosis not present

## 2020-07-14 DIAGNOSIS — Z79899 Other long term (current) drug therapy: Secondary | ICD-10-CM | POA: Diagnosis not present

## 2020-07-19 DIAGNOSIS — R278 Other lack of coordination: Secondary | ICD-10-CM | POA: Diagnosis not present

## 2020-07-19 DIAGNOSIS — H8110 Benign paroxysmal vertigo, unspecified ear: Secondary | ICD-10-CM | POA: Diagnosis not present

## 2020-07-26 ENCOUNTER — Other Ambulatory Visit: Payer: Self-pay | Admitting: Internal Medicine

## 2020-07-26 DIAGNOSIS — Z716 Tobacco abuse counseling: Secondary | ICD-10-CM

## 2020-07-30 ENCOUNTER — Other Ambulatory Visit: Payer: Self-pay | Admitting: Internal Medicine

## 2020-07-30 DIAGNOSIS — Z716 Tobacco abuse counseling: Secondary | ICD-10-CM

## 2020-08-03 DIAGNOSIS — H8110 Benign paroxysmal vertigo, unspecified ear: Secondary | ICD-10-CM | POA: Diagnosis not present

## 2020-08-03 DIAGNOSIS — R278 Other lack of coordination: Secondary | ICD-10-CM | POA: Diagnosis not present

## 2020-08-05 ENCOUNTER — Other Ambulatory Visit: Payer: Self-pay | Admitting: Internal Medicine

## 2020-08-05 DIAGNOSIS — R42 Dizziness and giddiness: Secondary | ICD-10-CM | POA: Diagnosis not present

## 2020-08-05 DIAGNOSIS — M19011 Primary osteoarthritis, right shoulder: Secondary | ICD-10-CM | POA: Diagnosis not present

## 2020-08-09 DIAGNOSIS — F32A Depression, unspecified: Secondary | ICD-10-CM | POA: Insufficient documentation

## 2020-08-12 ENCOUNTER — Other Ambulatory Visit: Payer: Self-pay

## 2020-08-12 ENCOUNTER — Ambulatory Visit (INDEPENDENT_AMBULATORY_CARE_PROVIDER_SITE_OTHER): Payer: Medicare Other | Admitting: Cardiology

## 2020-08-12 ENCOUNTER — Encounter: Payer: Self-pay | Admitting: Cardiology

## 2020-08-12 VITALS — BP 148/86 | HR 90 | Ht 61.6 in | Wt 177.2 lb

## 2020-08-12 DIAGNOSIS — I10 Essential (primary) hypertension: Secondary | ICD-10-CM | POA: Diagnosis not present

## 2020-08-12 DIAGNOSIS — R278 Other lack of coordination: Secondary | ICD-10-CM | POA: Diagnosis not present

## 2020-08-12 DIAGNOSIS — E782 Mixed hyperlipidemia: Secondary | ICD-10-CM | POA: Diagnosis not present

## 2020-08-12 DIAGNOSIS — H8110 Benign paroxysmal vertigo, unspecified ear: Secondary | ICD-10-CM | POA: Diagnosis not present

## 2020-08-12 DIAGNOSIS — I2510 Atherosclerotic heart disease of native coronary artery without angina pectoris: Secondary | ICD-10-CM

## 2020-08-12 HISTORY — DX: Atherosclerotic heart disease of native coronary artery without angina pectoris: I25.10

## 2020-08-12 NOTE — Progress Notes (Signed)
Cardiology Office Note:    Date:  08/12/2020   ID:  Shirley Williams, DOB 1955-05-07, MRN [id number]  PCP:  Shirley Johns, MD  Cardiologist:  Shirley Lindau, MD   Referring MD: Shirley Johns, MD    ASSESSMENT:    1. Essential hypertension   2. Mixed hyperlipidemia   3. Atherosclerosis of native coronary artery of native heart without angina pectoris    PLAN:    In order of problems listed above:  Coronary artery disease/coronary atherosclerosis: Secondary prevention stressed with the patient.  Importance of compliance with diet medication stressed and she vocalized understanding.  I told her to walk at least half an hour a day 5 days a week and she promises to do so. Essential hypertension: Blood pressure stable and diet was emphasized.  Lifestyle modification was encouraged. Mixed dyslipidemia: Lipids were reviewed.  Weight reduction was stressed, diet was emphasized risks of obesity explained and she promises to do better. Cigarette smoker: I spent 5 minutes with the patient discussing solely about smoking. Smoking cessation was counseled. I suggested to the patient also different medications and pharmacological interventions. Patient is keen to try stopping on its own at this time. He will get back to me if he needs any further assistance in this matter. Patient will be seen in follow-up appointment in 6 months or earlier if the patient has any concerns    Medication Adjustments/Labs and Tests Ordered: Current medicines are reviewed at length with the patient today.  Concerns regarding medicines are outlined above.  Orders Placed This Encounter  Procedures   EKG 12-Lead   No orders of the defined types were placed in this encounter.    No chief complaint on file.    History of Present Illness:    Shirley Williams is a 65 y.o. female.  Patient has past medical history of coronary atherosclerosis, essential hypertension mixed dyslipidemia and active smoker.  She is overweight.   She mentions to me that she walks on a regular basis.  No chest pain orthopnea or PND.  At the time of my evaluation, the patient is alert awake oriented and in no distress.  Past Medical History:  Diagnosis Date   Abnormal findings on diagnostic imaging of lung 08/13/2019   Acquired trigger finger of right middle finger 05/11/2019   Acute appendicitis 01/15/2012   Acute medial meniscus tear of left knee 05/19/2015   Acute paronychia of finger of left hand 01/13/2019   Aftercare following right knee joint replacement surgery 07/26/2015   Aftercare following surgery of the musculoskeletal system 07/20/2014   Amputation of third finger of left hand 07/20/2014   Anemia 05/13/2018   Anxiety 05/13/2018   Arthritis    Asthma 05/13/2018   Atelectasis of left middle ear 04/03/2016   Atrophic vaginitis 03/08/2020   Bilateral carpal tunnel syndrome 07/31/2018   Body mass index (bmi) 31.0-31.9, adult 03/12/2017   Chest discomfort 05/13/2018   Chronic Eustachian tube dysfunction, bilateral 12/07/2015   Chronic obstructive lung disease ([place]) 11/16/2019   Chronic pain syndrome 10/04/2015   [facility] DJD(carpometacarpal degenerative joint disease), localized primary 6/38/7564   Complication following left mastoidectomy 11/12/2019   Formatting of this note might be different from the original. Added automatically from request for surgery [id number]   Conductive hearing loss of left ear with restricted hearing of right ear 06/03/2020   COPD (chronic obstructive pulmonary disease) ([place]) 05/13/2018   COPD with chronic bronchitis and emphysema ([place]) 08/13/2019   Depression  Depressive disorder 05/13/2018   Elevated blood-pressure reading, without diagnosis of hypertension 10/07/2017   Essential hypertension 05/13/2018   ETD (Eustachian tube dysfunction), bilateral 12/07/2015   GERD (gastroesophageal reflux disease)    Hearing loss, mixed, bilateral 05/13/2018   Hiatal hernia 05/13/2018   History of tympanomastoidectomy 11/02/2019    Hyperlipidemia 05/13/2018   Impacted cerumen of left ear 11/17/2018   Internal derangement of left knee 09/17/2016   Knee effusion, right 03/22/2015   Lumbar radiculopathy 05/19/2015   Major depressive disorder, recurrent episode, in partial remission ([place]) 07/03/2016   MDD (major depressive disorder), recurrent, severe, with psychosis ([place]) 10/03/2015   Medication management 08/13/2019   Migraines 05/13/2018   Nail remnant of finger 11/03/2019   Ossicular chain disruption 01/20/2020   Osteomyelitis of finger of left hand ([place]) 02/03/2019   Osteoporosis 11/16/2019   Pain in both hands 09/19/2018   Panic disorder 07/03/2016   Personality disorder ([place]) 07/03/2016   Primary osteoarthritis of both first carpometacarpal joints 12/13/2014   Primary osteoarthritis of both knees 01/23/2015   Primary osteoarthritis of left knee 03/06/2017   Formatting of this note might be different from the original. Added automatically from request for surgery [id number]   Pubic ramus fracture, right, closed, initial encounter ([place]) 10/27/2018   Retraction pocket of tympanic membrane of left ear 12/07/2015   Risk for falls 06/20/2015   Spondylolisthesis at L5-S1 level 02/03/2019   Status post total right knee replacement 07/23/2016   Tear of lateral meniscus of right knee, current 05/19/2015   Tinnitus, left 05/13/2018   Tobacco use disorder 08/13/2019   Vertigo 07/07/2020   Formatting of this note might be different from the original. Possibly BPPV.    Past Surgical History:  Procedure Laterality Date   APPENDECTOMY     LAPAROSCOPIC APPENDECTOMY  01/15/2012   Procedure: APPENDECTOMY LAPAROSCOPIC;  Surgeon: Imogene Burn. Tsuei, MD;  Location: [facility];  Service: General;  Laterality: N/A;   TONSILLECTOMY      Current Medications: Current Meds  Medication Sig   albuterol (VENTOLIN HFA) 108 (90 Base) MCG/ACT inhaler Inhale 2 puffs into the lungs as needed for wheezing or shortness of breath.   ALPRAZolam (XANAX) 0.25 MG tablet Take 0.5  mg by mouth 2 (two) times daily as needed for anxiety.   atorvastatin (LIPITOR) 80 MG tablet Take 80 mg by mouth daily.   baclofen (LIORESAL) 10 MG tablet Take 10 mg by mouth 2 (two) times daily.   buPROPion (WELLBUTRIN SR) 150 MG 12 hr tablet Take 150 mg by mouth 2 (two) times daily.   DULoxetine (CYMBALTA) 30 MG capsule Take 30 mg by mouth daily.   DULoxetine (CYMBALTA) 60 MG capsule Take 1 capsule by mouth daily. Plus 30 mg to make 90 mg daily   esomeprazole (NEXIUM) 40 MG capsule Take 40 mg by mouth daily at 12 noon.   estradiol (ESTRACE) 0.1 MG/GM vaginal cream Place 1 Applicatorful vaginally 2 (two) times a week.   fluticasone (FLONASE) 50 MCG/ACT nasal spray Place 2 sprays into both nostrils daily.   gabapentin (NEURONTIN) 300 MG capsule Take 1 capsule by mouth 3 (three) times daily.    meloxicam (MOBIC) 15 MG tablet Take 1 tablet by mouth daily.   mirabegron ER (MYRBETRIQ) 50 MG TB24 tablet Take 50 mg by mouth daily.   mirtazapine (REMERON) 15 MG tablet Take 1 tablet by mouth daily.   PREMARIN vaginal cream Place 1 Applicatorful vaginally at bedtime.   Vitamin D, Ergocalciferol, (DRISDOL) 1.25 MG (50000  UNIT) CAPS capsule Take 50,000 Units by mouth once a week.     Allergies:   Sulfa antibiotics, Sulfacetamide sodium, Sulfabenzamide, and Tape   Social History   Socioeconomic History   Marital status: Married    Spouse name: Not on file   Number of children: Not on file   Years of education: Not on file   Highest education level: Not on file  Occupational History   Not on file  Tobacco Use   Smoking status: Every Day    Packs/day: 0.25    Years: 36.00    Pack years: 9.00    Types: Cigarettes   Smokeless tobacco: Never   Tobacco comments:    1-2 cigarettes smoked weekly 05/18/20 ARJ   Vaping Use   Vaping Use: Never used  Substance and Sexual Activity   Alcohol use: Yes    Comment: occ   Drug use: No   Sexual activity: Yes    Birth control/protection: Post-menopausal   Other Topics Concern   Not on file  Social History Narrative   Not on file   Social Determinants of Health   Financial Resource Strain: Not on file  Food Insecurity: Not on file  Transportation Needs: Not on file  Physical Activity: Not on file  Stress: Not on file  Social Connections: Not on file     Family History: The patient's family history includes Asthma in her sister; COPD in her mother; Cancer in her father; Depression in her sister.  ROS:   Please see the history of present illness.    All other systems reviewed and are negative.  EKGs/Labs/Other Studies Reviewed:    The following studies were reviewed today: I discussed my findings with the patient at extensive length.  EKG reveals sinus rhythm and nonspecific ST-T changes   Recent Labs: No results found for requested labs within last 8760 hours.  Recent Lipid Panel    Component Value Date/Time   CHOL 223 (H) 10/05/2015 0627   TRIG 175 (H) 10/05/2015 0627   HDL 65 10/05/2015 0627   CHOLHDL 3.4 10/05/2015 0627   VLDL 35 10/05/2015 0627   LDLCALC 123 (H) 10/05/2015 0627    Physical Exam:    VS:  BP (!) 148/86   Pulse 90   Ht 5' 1.6" (1.565 m)   Wt 177 lb 3.2 oz (80.4 kg)   LMP 12/04/2011   SpO2 98%   BMI 32.83 kg/m     Wt Readings from Last 3 Encounters:  08/12/20 177 lb 3.2 oz (80.4 kg)  05/18/20 170 lb 6.4 oz (77.3 kg)  11/16/19 167 lb 12.8 oz (76.1 kg)     GEN: Patient is in no acute distress HEENT: Normal NECK: No JVD; No carotid bruits LYMPHATICS: No lymphadenopathy CARDIAC: Hear sounds regular, 2/6 systolic murmur at the apex. RESPIRATORY:  Clear to auscultation without rales, wheezing or rhonchi  ABDOMEN: Soft, non-tender, non-distended MUSCULOSKELETAL:  No edema; No deformity  SKIN: Warm and dry NEUROLOGIC:  Alert and oriented x 3 PSYCHIATRIC:  Normal affect   Signed, Shirley Lindau, MD  08/12/2020 4:30 PM    [place]

## 2020-08-12 NOTE — Patient Instructions (Signed)

## 2020-08-16 DIAGNOSIS — M19011 Primary osteoarthritis, right shoulder: Secondary | ICD-10-CM | POA: Diagnosis not present

## 2020-08-16 DIAGNOSIS — I10 Essential (primary) hypertension: Secondary | ICD-10-CM | POA: Diagnosis not present

## 2020-08-17 DIAGNOSIS — H8110 Benign paroxysmal vertigo, unspecified ear: Secondary | ICD-10-CM | POA: Diagnosis not present

## 2020-08-17 DIAGNOSIS — R278 Other lack of coordination: Secondary | ICD-10-CM | POA: Diagnosis not present

## 2020-08-17 DIAGNOSIS — S81812A Laceration without foreign body, left lower leg, initial encounter: Secondary | ICD-10-CM | POA: Diagnosis not present

## 2020-08-18 DIAGNOSIS — Z9089 Acquired absence of other organs: Secondary | ICD-10-CM | POA: Diagnosis not present

## 2020-08-18 DIAGNOSIS — R42 Dizziness and giddiness: Secondary | ICD-10-CM | POA: Diagnosis not present

## 2020-08-18 DIAGNOSIS — Z9181 History of falling: Secondary | ICD-10-CM

## 2020-08-18 HISTORY — DX: History of falling: Z91.81

## 2020-08-19 DIAGNOSIS — R278 Other lack of coordination: Secondary | ICD-10-CM | POA: Diagnosis not present

## 2020-08-19 DIAGNOSIS — H8110 Benign paroxysmal vertigo, unspecified ear: Secondary | ICD-10-CM | POA: Diagnosis not present

## 2020-08-23 DIAGNOSIS — R278 Other lack of coordination: Secondary | ICD-10-CM | POA: Diagnosis not present

## 2020-08-23 DIAGNOSIS — H8110 Benign paroxysmal vertigo, unspecified ear: Secondary | ICD-10-CM | POA: Diagnosis not present

## 2020-08-26 DIAGNOSIS — H8110 Benign paroxysmal vertigo, unspecified ear: Secondary | ICD-10-CM | POA: Diagnosis not present

## 2020-08-26 DIAGNOSIS — R278 Other lack of coordination: Secondary | ICD-10-CM | POA: Diagnosis not present

## 2020-08-30 DIAGNOSIS — R278 Other lack of coordination: Secondary | ICD-10-CM | POA: Diagnosis not present

## 2020-08-30 DIAGNOSIS — H8110 Benign paroxysmal vertigo, unspecified ear: Secondary | ICD-10-CM | POA: Diagnosis not present

## 2020-09-02 DIAGNOSIS — R2681 Unsteadiness on feet: Secondary | ICD-10-CM | POA: Diagnosis not present

## 2020-09-02 DIAGNOSIS — Z7409 Other reduced mobility: Secondary | ICD-10-CM | POA: Diagnosis not present

## 2020-09-08 DIAGNOSIS — S81812A Laceration without foreign body, left lower leg, initial encounter: Secondary | ICD-10-CM | POA: Diagnosis not present

## 2020-09-08 DIAGNOSIS — M19011 Primary osteoarthritis, right shoulder: Secondary | ICD-10-CM | POA: Diagnosis not present

## 2020-09-08 DIAGNOSIS — M159 Polyosteoarthritis, unspecified: Secondary | ICD-10-CM | POA: Diagnosis not present

## 2020-09-08 DIAGNOSIS — Z9181 History of falling: Secondary | ICD-10-CM | POA: Diagnosis not present

## 2020-09-27 DIAGNOSIS — M7541 Impingement syndrome of right shoulder: Secondary | ICD-10-CM | POA: Diagnosis not present

## 2020-09-27 DIAGNOSIS — M25511 Pain in right shoulder: Secondary | ICD-10-CM | POA: Diagnosis not present

## 2020-11-17 DIAGNOSIS — I10 Essential (primary) hypertension: Secondary | ICD-10-CM | POA: Diagnosis not present

## 2020-11-17 DIAGNOSIS — F32A Depression, unspecified: Secondary | ICD-10-CM | POA: Diagnosis not present

## 2020-11-17 DIAGNOSIS — F17200 Nicotine dependence, unspecified, uncomplicated: Secondary | ICD-10-CM | POA: Diagnosis not present

## 2020-11-17 DIAGNOSIS — J449 Chronic obstructive pulmonary disease, unspecified: Secondary | ICD-10-CM | POA: Diagnosis not present

## 2020-11-17 DIAGNOSIS — E782 Mixed hyperlipidemia: Secondary | ICD-10-CM | POA: Diagnosis not present

## 2020-11-17 DIAGNOSIS — J309 Allergic rhinitis, unspecified: Secondary | ICD-10-CM | POA: Diagnosis not present

## 2020-11-17 DIAGNOSIS — E559 Vitamin D deficiency, unspecified: Secondary | ICD-10-CM | POA: Diagnosis not present

## 2020-11-17 DIAGNOSIS — K219 Gastro-esophageal reflux disease without esophagitis: Secondary | ICD-10-CM | POA: Diagnosis not present

## 2020-11-17 DIAGNOSIS — M1990 Unspecified osteoarthritis, unspecified site: Secondary | ICD-10-CM | POA: Diagnosis not present

## 2020-11-17 DIAGNOSIS — Z8719 Personal history of other diseases of the digestive system: Secondary | ICD-10-CM | POA: Diagnosis not present

## 2020-11-22 ENCOUNTER — Other Ambulatory Visit: Payer: Self-pay | Admitting: Internal Medicine

## 2020-12-05 DIAGNOSIS — Z20822 Contact with and (suspected) exposure to covid-19: Secondary | ICD-10-CM | POA: Diagnosis not present

## 2020-12-06 DIAGNOSIS — Z1231 Encounter for screening mammogram for malignant neoplasm of breast: Secondary | ICD-10-CM | POA: Diagnosis not present

## 2020-12-15 DIAGNOSIS — L820 Inflamed seborrheic keratosis: Secondary | ICD-10-CM | POA: Diagnosis not present

## 2020-12-15 DIAGNOSIS — D225 Melanocytic nevi of trunk: Secondary | ICD-10-CM | POA: Diagnosis not present

## 2020-12-15 DIAGNOSIS — L821 Other seborrheic keratosis: Secondary | ICD-10-CM | POA: Diagnosis not present

## 2020-12-16 DIAGNOSIS — U071 COVID-19: Secondary | ICD-10-CM | POA: Diagnosis not present

## 2020-12-16 DIAGNOSIS — J019 Acute sinusitis, unspecified: Secondary | ICD-10-CM | POA: Diagnosis not present

## 2020-12-16 DIAGNOSIS — J209 Acute bronchitis, unspecified: Secondary | ICD-10-CM | POA: Diagnosis not present

## 2021-01-03 DIAGNOSIS — E782 Mixed hyperlipidemia: Secondary | ICD-10-CM | POA: Diagnosis not present

## 2021-01-03 DIAGNOSIS — I10 Essential (primary) hypertension: Secondary | ICD-10-CM | POA: Diagnosis not present

## 2021-01-03 DIAGNOSIS — R5383 Other fatigue: Secondary | ICD-10-CM | POA: Diagnosis not present

## 2021-01-03 DIAGNOSIS — R0602 Shortness of breath: Secondary | ICD-10-CM | POA: Diagnosis not present

## 2021-01-03 DIAGNOSIS — Z20822 Contact with and (suspected) exposure to covid-19: Secondary | ICD-10-CM | POA: Diagnosis not present

## 2021-01-04 DIAGNOSIS — Z20822 Contact with and (suspected) exposure to covid-19: Secondary | ICD-10-CM | POA: Diagnosis not present

## 2021-01-05 DIAGNOSIS — Z20822 Contact with and (suspected) exposure to covid-19: Secondary | ICD-10-CM | POA: Diagnosis not present

## 2021-02-02 DIAGNOSIS — Z20822 Contact with and (suspected) exposure to covid-19: Secondary | ICD-10-CM | POA: Diagnosis not present

## 2021-02-02 DIAGNOSIS — R5383 Other fatigue: Secondary | ICD-10-CM | POA: Diagnosis not present

## 2021-02-02 DIAGNOSIS — I10 Essential (primary) hypertension: Secondary | ICD-10-CM | POA: Diagnosis not present

## 2021-02-03 DIAGNOSIS — Z20822 Contact with and (suspected) exposure to covid-19: Secondary | ICD-10-CM | POA: Diagnosis not present

## 2021-02-04 DIAGNOSIS — Z20822 Contact with and (suspected) exposure to covid-19: Secondary | ICD-10-CM | POA: Diagnosis not present

## 2021-02-28 ENCOUNTER — Other Ambulatory Visit: Payer: Self-pay | Admitting: Internal Medicine

## 2021-03-06 DIAGNOSIS — Z20822 Contact with and (suspected) exposure to covid-19: Secondary | ICD-10-CM | POA: Diagnosis not present

## 2021-03-07 DIAGNOSIS — Z20822 Contact with and (suspected) exposure to covid-19: Secondary | ICD-10-CM | POA: Diagnosis not present

## 2021-03-07 DIAGNOSIS — I10 Essential (primary) hypertension: Secondary | ICD-10-CM | POA: Diagnosis not present

## 2021-03-08 DIAGNOSIS — Z20822 Contact with and (suspected) exposure to covid-19: Secondary | ICD-10-CM | POA: Diagnosis not present

## 2021-03-22 ENCOUNTER — Other Ambulatory Visit: Payer: Self-pay | Admitting: Internal Medicine

## 2021-03-23 DIAGNOSIS — Z8719 Personal history of other diseases of the digestive system: Secondary | ICD-10-CM | POA: Diagnosis not present

## 2021-03-23 DIAGNOSIS — Z79899 Other long term (current) drug therapy: Secondary | ICD-10-CM | POA: Diagnosis not present

## 2021-03-23 DIAGNOSIS — E782 Mixed hyperlipidemia: Secondary | ICD-10-CM | POA: Diagnosis not present

## 2021-03-23 DIAGNOSIS — F32A Depression, unspecified: Secondary | ICD-10-CM | POA: Diagnosis not present

## 2021-03-23 DIAGNOSIS — F17200 Nicotine dependence, unspecified, uncomplicated: Secondary | ICD-10-CM | POA: Diagnosis not present

## 2021-03-23 DIAGNOSIS — M1990 Unspecified osteoarthritis, unspecified site: Secondary | ICD-10-CM | POA: Diagnosis not present

## 2021-03-23 DIAGNOSIS — E559 Vitamin D deficiency, unspecified: Secondary | ICD-10-CM | POA: Diagnosis not present

## 2021-03-23 DIAGNOSIS — J309 Allergic rhinitis, unspecified: Secondary | ICD-10-CM | POA: Diagnosis not present

## 2021-03-23 DIAGNOSIS — J449 Chronic obstructive pulmonary disease, unspecified: Secondary | ICD-10-CM | POA: Diagnosis not present

## 2021-03-23 DIAGNOSIS — I10 Essential (primary) hypertension: Secondary | ICD-10-CM | POA: Diagnosis not present

## 2021-03-23 DIAGNOSIS — K219 Gastro-esophageal reflux disease without esophagitis: Secondary | ICD-10-CM | POA: Diagnosis not present

## 2021-03-23 DIAGNOSIS — L819 Disorder of pigmentation, unspecified: Secondary | ICD-10-CM | POA: Diagnosis not present

## 2021-04-06 DIAGNOSIS — Z20822 Contact with and (suspected) exposure to covid-19: Secondary | ICD-10-CM | POA: Diagnosis not present

## 2021-04-07 DIAGNOSIS — Z20822 Contact with and (suspected) exposure to covid-19: Secondary | ICD-10-CM | POA: Diagnosis not present

## 2021-04-08 DIAGNOSIS — Z20822 Contact with and (suspected) exposure to covid-19: Secondary | ICD-10-CM | POA: Diagnosis not present

## 2021-04-11 DIAGNOSIS — I10 Essential (primary) hypertension: Secondary | ICD-10-CM | POA: Diagnosis not present

## 2021-04-17 DIAGNOSIS — L0390 Cellulitis, unspecified: Secondary | ICD-10-CM | POA: Diagnosis not present

## 2021-04-20 DIAGNOSIS — Z9089 Acquired absence of other organs: Secondary | ICD-10-CM | POA: Diagnosis not present

## 2021-04-20 DIAGNOSIS — H7292 Unspecified perforation of tympanic membrane, left ear: Secondary | ICD-10-CM

## 2021-04-20 HISTORY — DX: Unspecified perforation of tympanic membrane, left ear: H72.92

## 2021-05-16 ENCOUNTER — Telehealth: Payer: Self-pay | Admitting: Internal Medicine

## 2021-05-16 NOTE — Telephone Encounter (Signed)
Left voicemail for patient to call back to schedule over f/u. Mailed recall letter. ?

## 2021-05-16 NOTE — Telephone Encounter (Signed)
-----   Message from Spero Geralds, MD sent at 05/15/2021  7:55 PM EDT ----- ?Regarding: follow up ?Hi there, ?Needs 1 year follow up appointment. Please call and schedule.  ? ?Thanks! ?ND ? ?

## 2021-05-19 DIAGNOSIS — I10 Essential (primary) hypertension: Secondary | ICD-10-CM | POA: Diagnosis not present

## 2021-06-20 DIAGNOSIS — I10 Essential (primary) hypertension: Secondary | ICD-10-CM | POA: Diagnosis not present

## 2021-06-27 ENCOUNTER — Telehealth: Payer: Self-pay | Admitting: Acute Care

## 2021-06-27 DIAGNOSIS — E782 Mixed hyperlipidemia: Secondary | ICD-10-CM | POA: Diagnosis not present

## 2021-06-27 DIAGNOSIS — L819 Disorder of pigmentation, unspecified: Secondary | ICD-10-CM | POA: Diagnosis not present

## 2021-06-27 DIAGNOSIS — I10 Essential (primary) hypertension: Secondary | ICD-10-CM | POA: Diagnosis not present

## 2021-06-27 DIAGNOSIS — K219 Gastro-esophageal reflux disease without esophagitis: Secondary | ICD-10-CM | POA: Diagnosis not present

## 2021-06-27 DIAGNOSIS — M1990 Unspecified osteoarthritis, unspecified site: Secondary | ICD-10-CM | POA: Diagnosis not present

## 2021-06-27 DIAGNOSIS — J449 Chronic obstructive pulmonary disease, unspecified: Secondary | ICD-10-CM | POA: Diagnosis not present

## 2021-06-27 DIAGNOSIS — J309 Allergic rhinitis, unspecified: Secondary | ICD-10-CM | POA: Diagnosis not present

## 2021-06-27 DIAGNOSIS — E559 Vitamin D deficiency, unspecified: Secondary | ICD-10-CM | POA: Diagnosis not present

## 2021-06-27 NOTE — Telephone Encounter (Signed)
Spoke with patient who was at a doctor's appointment. Unable to schedule at this time ?

## 2021-06-30 ENCOUNTER — Ambulatory Visit: Payer: Medicare Other | Admitting: Internal Medicine

## 2021-07-20 DIAGNOSIS — I10 Essential (primary) hypertension: Secondary | ICD-10-CM | POA: Diagnosis not present

## 2021-09-12 DIAGNOSIS — I10 Essential (primary) hypertension: Secondary | ICD-10-CM | POA: Diagnosis not present

## 2021-09-12 DIAGNOSIS — Z0000 Encounter for general adult medical examination without abnormal findings: Secondary | ICD-10-CM | POA: Diagnosis not present

## 2021-09-12 DIAGNOSIS — Z139 Encounter for screening, unspecified: Secondary | ICD-10-CM | POA: Diagnosis not present

## 2021-09-12 DIAGNOSIS — M7989 Other specified soft tissue disorders: Secondary | ICD-10-CM | POA: Diagnosis not present

## 2021-09-12 DIAGNOSIS — L03116 Cellulitis of left lower limb: Secondary | ICD-10-CM | POA: Diagnosis not present

## 2021-09-12 DIAGNOSIS — Z9181 History of falling: Secondary | ICD-10-CM | POA: Diagnosis not present

## 2021-09-12 DIAGNOSIS — E785 Hyperlipidemia, unspecified: Secondary | ICD-10-CM | POA: Diagnosis not present

## 2021-09-18 DIAGNOSIS — I10 Essential (primary) hypertension: Secondary | ICD-10-CM | POA: Diagnosis not present

## 2021-09-22 DIAGNOSIS — L237 Allergic contact dermatitis due to plants, except food: Secondary | ICD-10-CM | POA: Diagnosis not present

## 2021-09-22 DIAGNOSIS — L03116 Cellulitis of left lower limb: Secondary | ICD-10-CM | POA: Diagnosis not present

## 2021-09-22 DIAGNOSIS — M7989 Other specified soft tissue disorders: Secondary | ICD-10-CM | POA: Diagnosis not present

## 2021-10-04 DIAGNOSIS — M1990 Unspecified osteoarthritis, unspecified site: Secondary | ICD-10-CM | POA: Diagnosis not present

## 2021-10-04 DIAGNOSIS — J449 Chronic obstructive pulmonary disease, unspecified: Secondary | ICD-10-CM | POA: Diagnosis not present

## 2021-10-04 DIAGNOSIS — E782 Mixed hyperlipidemia: Secondary | ICD-10-CM | POA: Diagnosis not present

## 2021-10-04 DIAGNOSIS — I10 Essential (primary) hypertension: Secondary | ICD-10-CM | POA: Diagnosis not present

## 2021-10-04 DIAGNOSIS — K219 Gastro-esophageal reflux disease without esophagitis: Secondary | ICD-10-CM | POA: Diagnosis not present

## 2021-11-20 DIAGNOSIS — I10 Essential (primary) hypertension: Secondary | ICD-10-CM | POA: Diagnosis not present

## 2021-11-30 ENCOUNTER — Other Ambulatory Visit: Payer: Self-pay | Admitting: Internal Medicine

## 2021-12-07 DIAGNOSIS — J449 Chronic obstructive pulmonary disease, unspecified: Secondary | ICD-10-CM | POA: Diagnosis not present

## 2021-12-07 DIAGNOSIS — I10 Essential (primary) hypertension: Secondary | ICD-10-CM | POA: Diagnosis not present

## 2021-12-07 DIAGNOSIS — E559 Vitamin D deficiency, unspecified: Secondary | ICD-10-CM | POA: Diagnosis not present

## 2021-12-07 DIAGNOSIS — E782 Mixed hyperlipidemia: Secondary | ICD-10-CM | POA: Diagnosis not present

## 2021-12-07 DIAGNOSIS — Z79899 Other long term (current) drug therapy: Secondary | ICD-10-CM | POA: Diagnosis not present

## 2021-12-07 DIAGNOSIS — Z23 Encounter for immunization: Secondary | ICD-10-CM | POA: Diagnosis not present

## 2021-12-07 DIAGNOSIS — M7989 Other specified soft tissue disorders: Secondary | ICD-10-CM | POA: Diagnosis not present

## 2021-12-07 DIAGNOSIS — L03116 Cellulitis of left lower limb: Secondary | ICD-10-CM | POA: Diagnosis not present

## 2021-12-07 DIAGNOSIS — K219 Gastro-esophageal reflux disease without esophagitis: Secondary | ICD-10-CM | POA: Diagnosis not present

## 2021-12-11 DIAGNOSIS — Z9181 History of falling: Secondary | ICD-10-CM | POA: Diagnosis not present

## 2021-12-11 DIAGNOSIS — H906 Mixed conductive and sensorineural hearing loss, bilateral: Secondary | ICD-10-CM | POA: Diagnosis not present

## 2021-12-11 DIAGNOSIS — H7292 Unspecified perforation of tympanic membrane, left ear: Secondary | ICD-10-CM | POA: Diagnosis not present

## 2021-12-11 DIAGNOSIS — Z9012 Acquired absence of left breast and nipple: Secondary | ICD-10-CM | POA: Insufficient documentation

## 2021-12-11 DIAGNOSIS — H6122 Impacted cerumen, left ear: Secondary | ICD-10-CM | POA: Diagnosis not present

## 2021-12-11 HISTORY — DX: Acquired absence of left breast and nipple: Z90.12

## 2021-12-19 DIAGNOSIS — I10 Essential (primary) hypertension: Secondary | ICD-10-CM | POA: Diagnosis not present

## 2021-12-19 DIAGNOSIS — Z1231 Encounter for screening mammogram for malignant neoplasm of breast: Secondary | ICD-10-CM | POA: Diagnosis not present

## 2022-01-02 DIAGNOSIS — R293 Abnormal posture: Secondary | ICD-10-CM | POA: Diagnosis not present

## 2022-01-02 DIAGNOSIS — R270 Ataxia, unspecified: Secondary | ICD-10-CM | POA: Diagnosis not present

## 2022-01-02 DIAGNOSIS — M2560 Stiffness of unspecified joint, not elsewhere classified: Secondary | ICD-10-CM | POA: Diagnosis not present

## 2022-01-02 DIAGNOSIS — M25512 Pain in left shoulder: Secondary | ICD-10-CM | POA: Diagnosis not present

## 2022-01-02 DIAGNOSIS — M542 Cervicalgia: Secondary | ICD-10-CM | POA: Diagnosis not present

## 2022-01-02 DIAGNOSIS — M25511 Pain in right shoulder: Secondary | ICD-10-CM | POA: Diagnosis not present

## 2022-01-02 DIAGNOSIS — Z9181 History of falling: Secondary | ICD-10-CM | POA: Diagnosis not present

## 2022-01-02 DIAGNOSIS — M6281 Muscle weakness (generalized): Secondary | ICD-10-CM | POA: Diagnosis not present

## 2022-01-02 DIAGNOSIS — H8110 Benign paroxysmal vertigo, unspecified ear: Secondary | ICD-10-CM | POA: Diagnosis not present

## 2022-01-08 DIAGNOSIS — M542 Cervicalgia: Secondary | ICD-10-CM | POA: Diagnosis not present

## 2022-01-08 DIAGNOSIS — R270 Ataxia, unspecified: Secondary | ICD-10-CM | POA: Diagnosis not present

## 2022-01-08 DIAGNOSIS — H8110 Benign paroxysmal vertigo, unspecified ear: Secondary | ICD-10-CM | POA: Diagnosis not present

## 2022-01-08 DIAGNOSIS — Z9181 History of falling: Secondary | ICD-10-CM | POA: Diagnosis not present

## 2022-01-08 DIAGNOSIS — R293 Abnormal posture: Secondary | ICD-10-CM | POA: Diagnosis not present

## 2022-01-08 DIAGNOSIS — M6281 Muscle weakness (generalized): Secondary | ICD-10-CM | POA: Diagnosis not present

## 2022-01-08 DIAGNOSIS — M2560 Stiffness of unspecified joint, not elsewhere classified: Secondary | ICD-10-CM | POA: Diagnosis not present

## 2022-01-08 DIAGNOSIS — M25511 Pain in right shoulder: Secondary | ICD-10-CM | POA: Diagnosis not present

## 2022-01-08 DIAGNOSIS — M25512 Pain in left shoulder: Secondary | ICD-10-CM | POA: Diagnosis not present

## 2022-01-11 DIAGNOSIS — M7989 Other specified soft tissue disorders: Secondary | ICD-10-CM | POA: Diagnosis not present

## 2022-01-11 DIAGNOSIS — I10 Essential (primary) hypertension: Secondary | ICD-10-CM | POA: Diagnosis not present

## 2022-01-11 DIAGNOSIS — J449 Chronic obstructive pulmonary disease, unspecified: Secondary | ICD-10-CM | POA: Diagnosis not present

## 2022-01-11 DIAGNOSIS — L03116 Cellulitis of left lower limb: Secondary | ICD-10-CM | POA: Diagnosis not present

## 2022-01-11 DIAGNOSIS — J309 Allergic rhinitis, unspecified: Secondary | ICD-10-CM | POA: Diagnosis not present

## 2022-01-16 DIAGNOSIS — Z9181 History of falling: Secondary | ICD-10-CM | POA: Diagnosis not present

## 2022-01-16 DIAGNOSIS — M25512 Pain in left shoulder: Secondary | ICD-10-CM | POA: Diagnosis not present

## 2022-01-16 DIAGNOSIS — R270 Ataxia, unspecified: Secondary | ICD-10-CM | POA: Diagnosis not present

## 2022-01-16 DIAGNOSIS — M25511 Pain in right shoulder: Secondary | ICD-10-CM | POA: Diagnosis not present

## 2022-01-16 DIAGNOSIS — M6281 Muscle weakness (generalized): Secondary | ICD-10-CM | POA: Diagnosis not present

## 2022-01-16 DIAGNOSIS — H8110 Benign paroxysmal vertigo, unspecified ear: Secondary | ICD-10-CM | POA: Diagnosis not present

## 2022-01-16 DIAGNOSIS — M542 Cervicalgia: Secondary | ICD-10-CM | POA: Diagnosis not present

## 2022-01-16 DIAGNOSIS — R293 Abnormal posture: Secondary | ICD-10-CM | POA: Diagnosis not present

## 2022-01-16 DIAGNOSIS — M2560 Stiffness of unspecified joint, not elsewhere classified: Secondary | ICD-10-CM | POA: Diagnosis not present

## 2022-01-17 DIAGNOSIS — H6693 Otitis media, unspecified, bilateral: Secondary | ICD-10-CM | POA: Diagnosis not present

## 2022-01-17 DIAGNOSIS — H906 Mixed conductive and sensorineural hearing loss, bilateral: Secondary | ICD-10-CM | POA: Diagnosis not present

## 2022-01-17 DIAGNOSIS — Z87891 Personal history of nicotine dependence: Secondary | ICD-10-CM | POA: Diagnosis not present

## 2022-01-17 DIAGNOSIS — Z9089 Acquired absence of other organs: Secondary | ICD-10-CM | POA: Diagnosis not present

## 2022-01-17 DIAGNOSIS — H7292 Unspecified perforation of tympanic membrane, left ear: Secondary | ICD-10-CM | POA: Diagnosis not present

## 2022-01-17 DIAGNOSIS — Z9889 Other specified postprocedural states: Secondary | ICD-10-CM | POA: Diagnosis not present

## 2022-01-17 DIAGNOSIS — H7420 Discontinuity and dislocation of ear ossicles, unspecified ear: Secondary | ICD-10-CM | POA: Diagnosis not present

## 2022-01-17 DIAGNOSIS — Z9012 Acquired absence of left breast and nipple: Secondary | ICD-10-CM | POA: Diagnosis not present

## 2022-01-17 DIAGNOSIS — H8110 Benign paroxysmal vertigo, unspecified ear: Secondary | ICD-10-CM | POA: Diagnosis not present

## 2022-01-18 DIAGNOSIS — H9190 Unspecified hearing loss, unspecified ear: Secondary | ICD-10-CM | POA: Insufficient documentation

## 2022-01-18 DIAGNOSIS — H906 Mixed conductive and sensorineural hearing loss, bilateral: Secondary | ICD-10-CM

## 2022-01-18 HISTORY — DX: Unspecified hearing loss, unspecified ear: H91.90

## 2022-01-18 HISTORY — DX: Mixed conductive and sensorineural hearing loss, bilateral: H90.6

## 2022-01-22 ENCOUNTER — Encounter: Payer: Self-pay | Admitting: Cardiology

## 2022-01-22 ENCOUNTER — Ambulatory Visit: Payer: Medicare Other | Attending: Cardiology | Admitting: Cardiology

## 2022-01-22 VITALS — BP 118/68 | HR 98 | Ht 62.0 in | Wt 152.6 lb

## 2022-01-22 DIAGNOSIS — E782 Mixed hyperlipidemia: Secondary | ICD-10-CM | POA: Diagnosis not present

## 2022-01-22 DIAGNOSIS — I10 Essential (primary) hypertension: Secondary | ICD-10-CM | POA: Diagnosis not present

## 2022-01-22 DIAGNOSIS — I2510 Atherosclerotic heart disease of native coronary artery without angina pectoris: Secondary | ICD-10-CM | POA: Diagnosis not present

## 2022-01-22 DIAGNOSIS — F17210 Nicotine dependence, cigarettes, uncomplicated: Secondary | ICD-10-CM

## 2022-01-22 DIAGNOSIS — F17200 Nicotine dependence, unspecified, uncomplicated: Secondary | ICD-10-CM

## 2022-01-22 NOTE — Patient Instructions (Signed)

## 2022-01-22 NOTE — Progress Notes (Signed)
Cardiology Office Note:    Date:  01/22/2022   ID:  Shirley Williams, DOB November 09, 1955, MRN [id number]  PCP:  Nicholos Johns, MD  Cardiologist:  Jenean Lindau, MD   Referring MD: Nicholos Johns, MD    ASSESSMENT:    1. Atherosclerosis of native coronary artery of native heart without angina pectoris   2. Essential hypertension   3. Mixed hyperlipidemia   4. Tobacco use disorder    PLAN:    In order of problems listed above:  Coronary artery disease: Secondary prevention stressed with the patient.  Importance of compliance with diet and medication stressed and she vocalized understanding.  She was advised to walk at least half an hour a day 5 days a week and she is going to try to do this. Essential hypertension: Blood pressure is stable and diet was emphasized.  Lifestyle modification urged. Mixed dyslipidemia: On lipid-lowering medications followed by primary care.  Lipids reviewed. Cigarette smoker: I spent 5 minutes with the patient discussing solely about smoking. Smoking cessation was counseled. I suggested to the patient also different medications and pharmacological interventions. Patient is keen to try stopping on its own at this time. He will get back to me if he needs any further assistance in this matter. Patient will be seen in follow-up appointment in 9 months or earlier if the patient has any concerns    Medication Adjustments/Labs and Tests Ordered: Current medicines are reviewed at length with the patient today.  Concerns regarding medicines are outlined above.  No orders of the defined types were placed in this encounter.  No orders of the defined types were placed in this encounter.    No chief complaint on file.    History of Present Illness:    Shirley Williams is a 66 y.o. female.  Patient has past medical history of atherosclerotic vascular disease and coronary artery calcification, essential hypertension, dyslipidemia and unfortunately continues to smoke.  She  walks on a regular basis without any symptoms.  No chest pain orthopnea or PND.  She is here for routine follow-up.  At the time of my evaluation, the patient is alert awake oriented and in no distress.  Past Medical History:  Diagnosis Date   Abnormal findings on diagnostic imaging of lung 08/13/2019   Acquired trigger finger of right middle finger 05/11/2019   Acute appendicitis 01/15/2012   Acute medial meniscus tear of left knee 05/19/2015   Acute paronychia of finger of left hand 01/13/2019   Aftercare following right knee joint replacement surgery 07/26/2015   Aftercare following surgery of the musculoskeletal system 07/20/2014   Amputation of third finger of left hand 07/20/2014   Anemia 05/13/2018   Anxiety 05/13/2018   Arthritis    Asthma 05/13/2018   Atelectasis of left middle ear 04/03/2016   Atrophic vaginitis 03/08/2020   Bilateral carpal tunnel syndrome 07/31/2018   Body mass index (bmi) 31.0-31.9, adult 03/12/2017   Chest discomfort 05/13/2018   Chronic Eustachian tube dysfunction, bilateral 12/07/2015   Chronic obstructive lung disease ([place]) 11/16/2019   Chronic pain syndrome 10/04/2015   [facility] DJD(carpometacarpal degenerative joint disease), localized primary 16/60/6301   Complication following left mastoidectomy 11/12/2019   Formatting of this note might be different from the original. Added automatically from request for surgery [id number]   Conductive hearing loss of left ear with restricted hearing of right ear 06/03/2020   COPD (chronic obstructive pulmonary disease) ([place]) 05/13/2018   COPD with chronic bronchitis and emphysema ([place]) 08/13/2019  Coronary atherosclerosis 08/12/2020   Depression    Depressive disorder 05/13/2018   Elevated blood-pressure reading, without diagnosis of hypertension 10/07/2017   Essential hypertension 05/13/2018   ETD (Eustachian tube dysfunction), bilateral 12/07/2015   GERD (gastroesophageal reflux disease)    Hearing loss  01/18/2022   Hearing loss, mixed, bilateral 05/13/2018   Hiatal hernia 05/13/2018   History of fall 08/18/2020   History of left mastectomy 12/11/2021   History of mastoidectomy 11/02/2019   History of tympanomastoidectomy 11/02/2019   Hyperlipidemia 05/13/2018   Impacted cerumen of left ear 11/17/2018   Internal derangement of left knee 09/17/2016   Knee effusion, right 03/22/2015   Lumbar radiculopathy 05/19/2015   Major depressive disorder, recurrent episode, in partial remission ([place]) 07/03/2016   MDD (major depressive disorder), recurrent, severe, with psychosis ([place]) 10/03/2015   Medication management 08/13/2019   Migraines 05/13/2018   Mixed conductive and sensorineural hearing loss of both ears 01/18/2022   Nail remnant of finger 11/03/2019   Ossicular chain disruption 01/20/2020   Osteomyelitis of finger of left hand ([place]) 02/03/2019   Osteoporosis 11/16/2019   Pain in both hands 09/19/2018   Panic disorder 07/03/2016   Perforation of left tympanic membrane 04/20/2021   Personality disorder ([place]) 07/03/2016   Primary osteoarthritis of both first carpometacarpal joints 12/13/2014   Primary osteoarthritis of both knees 01/23/2015   Primary osteoarthritis of left knee 03/06/2017   Formatting of this note might be different from the original. Added automatically from request for surgery [id number]   Pubic ramus fracture, right, closed, initial encounter ([place]) 10/27/2018   Retraction pocket of tympanic membrane of left ear 12/07/2015   Risk for falls 06/20/2015   Spondylolisthesis at L5-S1 level 02/03/2019   Status post total right knee replacement 07/23/2016   Tear of lateral meniscus of right knee, current 05/19/2015   Tinnitus, left 05/13/2018   Tobacco use disorder 08/13/2019   Vertigo 07/07/2020   Formatting of this note might be different from the original. Possibly BPPV.    Past Surgical History:  Procedure Laterality Date   APPENDECTOMY     LAPAROSCOPIC  APPENDECTOMY  01/15/2012   Procedure: APPENDECTOMY LAPAROSCOPIC;  Surgeon: Imogene Burn. Tsuei, MD;  Location: [facility];  Service: General;  Laterality: N/A;   TONSILLECTOMY      Current Medications: Current Meds  Medication Sig   ALPRAZolam (XANAX) 0.25 MG tablet Take 0.5 mg by mouth 2 (two) times daily as needed for anxiety.   atorvastatin (LIPITOR) 80 MG tablet Take 80 mg by mouth daily.   baclofen (LIORESAL) 10 MG tablet Take 10 mg by mouth 2 (two) times daily.   DULoxetine (CYMBALTA) 30 MG capsule Take 30 mg by mouth daily.   DULoxetine (CYMBALTA) 60 MG capsule Take 1 capsule by mouth daily. Plus 30 mg to make 90 mg daily   esomeprazole (NEXIUM) 40 MG capsule Take 40 mg by mouth daily at 12 noon.   estradiol (ESTRACE) 0.1 MG/GM vaginal cream Place 1 Applicatorful vaginally as needed (dryness).   fluticasone (FLONASE) 50 MCG/ACT nasal spray Place 2 sprays into both nostrils daily.   furosemide (LASIX) 20 MG tablet Take 20 mg by mouth daily as needed for fluid or edema.   mirabegron ER (MYRBETRIQ) 50 MG TB24 tablet Take 50 mg by mouth daily.   PREMARIN vaginal cream Place 1 Applicatorful vaginally at bedtime.   PROAIR HFA 108 (90 Base) MCG/ACT inhaler INHALE 2 PUFFS EVERY 4-6 HOURS AS NEEDED   traZODone (DESYREL) 100 MG tablet Take  100 mg by mouth at bedtime.   Vitamin D, Ergocalciferol, (DRISDOL) 1.25 MG (50000 UNIT) CAPS capsule Take 50,000 Units by mouth once a week.     Allergies:   Sulfa antibiotics, Sulfacetamide sodium, Sulfabenzamide, and Tape   Social History   Socioeconomic History   Marital status: Married    Spouse name: Not on file   Number of children: Not on file   Years of education: Not on file   Highest education level: Not on file  Occupational History   Not on file  Tobacco Use   Smoking status: Every Day    Packs/day: 0.25    Years: 36.00    Total pack years: 9.00    Types: Cigarettes   Smokeless tobacco: Never   Tobacco comments:    1-2 cigarettes smoked  weekly 05/18/20 [facility]   Vaping Use   Vaping Use: Never used  Substance and Sexual Activity   Alcohol use: Yes    Comment: occ   Drug use: No   Sexual activity: Yes    Birth control/protection: Post-menopausal  Other Topics Concern   Not on file  Social History Narrative   Not on file   Social Determinants of Health   Financial Resource Strain: Not on file  Food Insecurity: Not on file  Transportation Needs: Not on file  Physical Activity: Not on file  Stress: Not on file  Social Connections: Not on file     Family History: The patient's family history includes Asthma in her sister; COPD in her mother; Cancer in her father; Depression in her sister.  ROS:   Please see the history of present illness.    All other systems reviewed and are negative.  EKGs/Labs/Other Studies Reviewed:    The following studies were reviewed today: EKG reveals sinus rhythm and poor anterior forces.   Recent Labs: No results found for requested labs within last 365 days.  Recent Lipid Panel    Component Value Date/Time   CHOL 223 (H) 10/05/2015 0627   TRIG 175 (H) 10/05/2015 0627   HDL 65 10/05/2015 0627   CHOLHDL 3.4 10/05/2015 0627   VLDL 35 10/05/2015 0627   LDLCALC 123 (H) 10/05/2015 0627    Physical Exam:    VS:  BP 118/68   Pulse 98   Ht '5\' 2"'$  (1.575 m)   Wt 152 lb 9.6 oz (69.2 kg)   LMP 12/04/2011   SpO2 98%   BMI 27.91 kg/m     Wt Readings from Last 3 Encounters:  01/22/22 152 lb 9.6 oz (69.2 kg)  08/12/20 177 lb 3.2 oz (80.4 kg)  05/18/20 170 lb 6.4 oz (77.3 kg)     GEN: Patient is in no acute distress HEENT: Normal NECK: No JVD; No carotid bruits LYMPHATICS: No lymphadenopathy CARDIAC: Hear sounds regular, 2/6 systolic murmur at the apex. RESPIRATORY:  Clear to auscultation without rales, wheezing or rhonchi  ABDOMEN: Soft, non-tender, non-distended MUSCULOSKELETAL:  No edema; No deformity  SKIN: Warm and dry NEUROLOGIC:  Alert and oriented x 3 PSYCHIATRIC:   Normal affect   Signed, Jenean Lindau, MD  01/22/2022 3:47 PM    [facility]

## 2022-02-13 DIAGNOSIS — Z882 Allergy status to sulfonamides status: Secondary | ICD-10-CM | POA: Diagnosis not present

## 2022-02-13 DIAGNOSIS — Z9012 Acquired absence of left breast and nipple: Secondary | ICD-10-CM | POA: Diagnosis not present

## 2022-02-13 DIAGNOSIS — Z9089 Acquired absence of other organs: Secondary | ICD-10-CM | POA: Diagnosis not present

## 2022-02-13 DIAGNOSIS — H7420 Discontinuity and dislocation of ear ossicles, unspecified ear: Secondary | ICD-10-CM | POA: Diagnosis not present

## 2022-02-13 DIAGNOSIS — H906 Mixed conductive and sensorineural hearing loss, bilateral: Secondary | ICD-10-CM | POA: Diagnosis not present

## 2022-02-13 DIAGNOSIS — H903 Sensorineural hearing loss, bilateral: Secondary | ICD-10-CM | POA: Diagnosis not present

## 2022-02-13 DIAGNOSIS — Z974 Presence of external hearing-aid: Secondary | ICD-10-CM | POA: Diagnosis not present

## 2022-02-13 DIAGNOSIS — H6693 Otitis media, unspecified, bilateral: Secondary | ICD-10-CM | POA: Diagnosis not present

## 2022-02-13 DIAGNOSIS — R42 Dizziness and giddiness: Secondary | ICD-10-CM | POA: Diagnosis not present

## 2022-02-13 DIAGNOSIS — Z87891 Personal history of nicotine dependence: Secondary | ICD-10-CM | POA: Diagnosis not present

## 2022-02-13 DIAGNOSIS — H7292 Unspecified perforation of tympanic membrane, left ear: Secondary | ICD-10-CM | POA: Diagnosis not present

## 2022-02-14 ENCOUNTER — Encounter: Payer: Self-pay | Admitting: *Deleted

## 2022-03-01 DIAGNOSIS — H906 Mixed conductive and sensorineural hearing loss, bilateral: Secondary | ICD-10-CM | POA: Diagnosis not present

## 2022-03-06 DIAGNOSIS — R509 Fever, unspecified: Secondary | ICD-10-CM | POA: Diagnosis not present

## 2022-03-16 DIAGNOSIS — I10 Essential (primary) hypertension: Secondary | ICD-10-CM | POA: Diagnosis not present

## 2022-04-17 DIAGNOSIS — I10 Essential (primary) hypertension: Secondary | ICD-10-CM | POA: Diagnosis not present

## 2022-04-23 DIAGNOSIS — E782 Mixed hyperlipidemia: Secondary | ICD-10-CM | POA: Diagnosis not present

## 2022-04-23 DIAGNOSIS — Z9181 History of falling: Secondary | ICD-10-CM | POA: Diagnosis not present

## 2022-04-23 DIAGNOSIS — I10 Essential (primary) hypertension: Secondary | ICD-10-CM | POA: Diagnosis not present

## 2022-04-23 DIAGNOSIS — I700 Atherosclerosis of aorta: Secondary | ICD-10-CM | POA: Diagnosis not present

## 2022-05-24 DIAGNOSIS — I10 Essential (primary) hypertension: Secondary | ICD-10-CM | POA: Diagnosis not present

## 2022-05-30 DIAGNOSIS — M79642 Pain in left hand: Secondary | ICD-10-CM | POA: Diagnosis not present

## 2022-05-30 DIAGNOSIS — M545 Low back pain, unspecified: Secondary | ICD-10-CM | POA: Diagnosis not present

## 2022-05-30 DIAGNOSIS — M1991 Primary osteoarthritis, unspecified site: Secondary | ICD-10-CM | POA: Diagnosis not present

## 2022-05-30 DIAGNOSIS — M064 Inflammatory polyarthropathy: Secondary | ICD-10-CM | POA: Diagnosis not present

## 2022-05-30 DIAGNOSIS — M79672 Pain in left foot: Secondary | ICD-10-CM | POA: Diagnosis not present

## 2022-05-30 DIAGNOSIS — M79671 Pain in right foot: Secondary | ICD-10-CM | POA: Diagnosis not present

## 2022-05-30 DIAGNOSIS — R5383 Other fatigue: Secondary | ICD-10-CM | POA: Diagnosis not present

## 2022-05-30 DIAGNOSIS — M79641 Pain in right hand: Secondary | ICD-10-CM | POA: Diagnosis not present

## 2022-06-04 DIAGNOSIS — H905 Unspecified sensorineural hearing loss: Secondary | ICD-10-CM | POA: Diagnosis not present

## 2022-06-04 DIAGNOSIS — M545 Low back pain, unspecified: Secondary | ICD-10-CM | POA: Diagnosis not present

## 2022-06-04 DIAGNOSIS — K5909 Other constipation: Secondary | ICD-10-CM | POA: Diagnosis not present

## 2022-06-07 DIAGNOSIS — M9904 Segmental and somatic dysfunction of sacral region: Secondary | ICD-10-CM | POA: Diagnosis not present

## 2022-06-07 DIAGNOSIS — M5136 Other intervertebral disc degeneration, lumbar region: Secondary | ICD-10-CM | POA: Diagnosis not present

## 2022-06-07 DIAGNOSIS — M9903 Segmental and somatic dysfunction of lumbar region: Secondary | ICD-10-CM | POA: Diagnosis not present

## 2022-06-07 DIAGNOSIS — M9905 Segmental and somatic dysfunction of pelvic region: Secondary | ICD-10-CM | POA: Diagnosis not present

## 2022-06-13 DIAGNOSIS — M5136 Other intervertebral disc degeneration, lumbar region: Secondary | ICD-10-CM | POA: Diagnosis not present

## 2022-06-13 DIAGNOSIS — M9905 Segmental and somatic dysfunction of pelvic region: Secondary | ICD-10-CM | POA: Diagnosis not present

## 2022-06-13 DIAGNOSIS — M9904 Segmental and somatic dysfunction of sacral region: Secondary | ICD-10-CM | POA: Diagnosis not present

## 2022-06-13 DIAGNOSIS — M9903 Segmental and somatic dysfunction of lumbar region: Secondary | ICD-10-CM | POA: Diagnosis not present

## 2022-06-19 ENCOUNTER — Ambulatory Visit (INDEPENDENT_AMBULATORY_CARE_PROVIDER_SITE_OTHER): Payer: 59 | Admitting: Clinical

## 2022-06-19 DIAGNOSIS — F330 Major depressive disorder, recurrent, mild: Secondary | ICD-10-CM

## 2022-06-19 DIAGNOSIS — F411 Generalized anxiety disorder: Secondary | ICD-10-CM | POA: Diagnosis not present
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
## 2022-06-19 NOTE — Progress Notes (Signed)
                Myrel Rappleye, LCSW 

## 2022-06-20 DIAGNOSIS — M9905 Segmental and somatic dysfunction of pelvic region: Secondary | ICD-10-CM | POA: Diagnosis not present

## 2022-06-20 DIAGNOSIS — M1991 Primary osteoarthritis, unspecified site: Secondary | ICD-10-CM | POA: Diagnosis not present

## 2022-06-20 DIAGNOSIS — M79642 Pain in left hand: Secondary | ICD-10-CM | POA: Diagnosis not present

## 2022-06-20 DIAGNOSIS — M545 Low back pain, unspecified: Secondary | ICD-10-CM | POA: Diagnosis not present

## 2022-06-20 DIAGNOSIS — M5136 Other intervertebral disc degeneration, lumbar region: Secondary | ICD-10-CM | POA: Diagnosis not present

## 2022-06-20 DIAGNOSIS — M79641 Pain in right hand: Secondary | ICD-10-CM | POA: Diagnosis not present

## 2022-06-20 DIAGNOSIS — M9903 Segmental and somatic dysfunction of lumbar region: Secondary | ICD-10-CM | POA: Diagnosis not present

## 2022-06-20 DIAGNOSIS — M9904 Segmental and somatic dysfunction of sacral region: Secondary | ICD-10-CM | POA: Diagnosis not present

## 2022-06-27 DIAGNOSIS — M5136 Other intervertebral disc degeneration, lumbar region: Secondary | ICD-10-CM | POA: Diagnosis not present

## 2022-06-27 DIAGNOSIS — M9904 Segmental and somatic dysfunction of sacral region: Secondary | ICD-10-CM | POA: Diagnosis not present

## 2022-06-27 DIAGNOSIS — M9903 Segmental and somatic dysfunction of lumbar region: Secondary | ICD-10-CM | POA: Diagnosis not present

## 2022-06-27 DIAGNOSIS — M9905 Segmental and somatic dysfunction of pelvic region: Secondary | ICD-10-CM | POA: Diagnosis not present

## 2022-07-03 DIAGNOSIS — H25813 Combined forms of age-related cataract, bilateral: Secondary | ICD-10-CM | POA: Diagnosis not present

## 2022-07-03 DIAGNOSIS — H353131 Nonexudative age-related macular degeneration, bilateral, early dry stage: Secondary | ICD-10-CM | POA: Diagnosis not present

## 2022-07-04 DIAGNOSIS — M9904 Segmental and somatic dysfunction of sacral region: Secondary | ICD-10-CM | POA: Diagnosis not present

## 2022-07-04 DIAGNOSIS — M9905 Segmental and somatic dysfunction of pelvic region: Secondary | ICD-10-CM | POA: Diagnosis not present

## 2022-07-04 DIAGNOSIS — M9903 Segmental and somatic dysfunction of lumbar region: Secondary | ICD-10-CM | POA: Diagnosis not present

## 2022-07-04 DIAGNOSIS — M5136 Other intervertebral disc degeneration, lumbar region: Secondary | ICD-10-CM | POA: Diagnosis not present

## 2022-07-10 ENCOUNTER — Ambulatory Visit (INDEPENDENT_AMBULATORY_CARE_PROVIDER_SITE_OTHER): Payer: 59 | Admitting: Clinical

## 2022-07-10 DIAGNOSIS — F330 Major depressive disorder, recurrent, mild: Secondary | ICD-10-CM

## 2022-07-10 DIAGNOSIS — F411 Generalized anxiety disorder: Secondary | ICD-10-CM

## 2022-07-10 NOTE — Progress Notes (Signed)
                Milinda Sweeney, LCSW 
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
## 2022-07-17 DIAGNOSIS — M9903 Segmental and somatic dysfunction of lumbar region: Secondary | ICD-10-CM | POA: Diagnosis not present

## 2022-07-17 DIAGNOSIS — M9904 Segmental and somatic dysfunction of sacral region: Secondary | ICD-10-CM | POA: Diagnosis not present

## 2022-07-17 DIAGNOSIS — M5136 Other intervertebral disc degeneration, lumbar region: Secondary | ICD-10-CM | POA: Diagnosis not present

## 2022-07-17 DIAGNOSIS — M9905 Segmental and somatic dysfunction of pelvic region: Secondary | ICD-10-CM | POA: Diagnosis not present

## 2022-07-19 DIAGNOSIS — M79641 Pain in right hand: Secondary | ICD-10-CM | POA: Diagnosis not present

## 2022-07-19 DIAGNOSIS — M65341 Trigger finger, right ring finger: Secondary | ICD-10-CM | POA: Diagnosis not present

## 2022-07-19 DIAGNOSIS — M65331 Trigger finger, right middle finger: Secondary | ICD-10-CM | POA: Diagnosis not present

## 2022-07-24 DIAGNOSIS — J449 Chronic obstructive pulmonary disease, unspecified: Secondary | ICD-10-CM | POA: Diagnosis not present

## 2022-07-24 DIAGNOSIS — L299 Pruritus, unspecified: Secondary | ICD-10-CM | POA: Diagnosis not present

## 2022-07-24 DIAGNOSIS — Z01818 Encounter for other preprocedural examination: Secondary | ICD-10-CM | POA: Diagnosis not present

## 2022-07-24 DIAGNOSIS — W57XXXA Bitten or stung by nonvenomous insect and other nonvenomous arthropods, initial encounter: Secondary | ICD-10-CM | POA: Diagnosis not present

## 2022-07-27 DIAGNOSIS — Z45320 Encounter for adjustment and management of bone conduction device: Secondary | ICD-10-CM | POA: Diagnosis not present

## 2022-07-27 DIAGNOSIS — H906 Mixed conductive and sensorineural hearing loss, bilateral: Secondary | ICD-10-CM | POA: Diagnosis not present

## 2022-08-03 DIAGNOSIS — Z09 Encounter for follow-up examination after completed treatment for conditions other than malignant neoplasm: Secondary | ICD-10-CM | POA: Diagnosis not present

## 2022-08-03 DIAGNOSIS — Z9621 Cochlear implant status: Secondary | ICD-10-CM | POA: Diagnosis not present

## 2022-08-09 DIAGNOSIS — I10 Essential (primary) hypertension: Secondary | ICD-10-CM | POA: Diagnosis not present

## 2022-08-21 DIAGNOSIS — I10 Essential (primary) hypertension: Secondary | ICD-10-CM | POA: Diagnosis not present

## 2022-08-21 DIAGNOSIS — J309 Allergic rhinitis, unspecified: Secondary | ICD-10-CM | POA: Diagnosis not present

## 2022-08-21 DIAGNOSIS — E782 Mixed hyperlipidemia: Secondary | ICD-10-CM | POA: Diagnosis not present

## 2022-08-21 DIAGNOSIS — K219 Gastro-esophageal reflux disease without esophagitis: Secondary | ICD-10-CM | POA: Diagnosis not present

## 2022-08-21 DIAGNOSIS — M1990 Unspecified osteoarthritis, unspecified site: Secondary | ICD-10-CM | POA: Diagnosis not present

## 2022-08-21 DIAGNOSIS — J449 Chronic obstructive pulmonary disease, unspecified: Secondary | ICD-10-CM | POA: Diagnosis not present

## 2022-08-29 DIAGNOSIS — E782 Mixed hyperlipidemia: Secondary | ICD-10-CM | POA: Diagnosis not present

## 2022-08-29 DIAGNOSIS — E559 Vitamin D deficiency, unspecified: Secondary | ICD-10-CM | POA: Diagnosis not present

## 2022-08-29 DIAGNOSIS — Z79899 Other long term (current) drug therapy: Secondary | ICD-10-CM | POA: Diagnosis not present

## 2022-09-03 DIAGNOSIS — K649 Unspecified hemorrhoids: Secondary | ICD-10-CM | POA: Diagnosis not present

## 2022-09-03 DIAGNOSIS — R8271 Bacteriuria: Secondary | ICD-10-CM | POA: Diagnosis not present

## 2022-09-17 DIAGNOSIS — I10 Essential (primary) hypertension: Secondary | ICD-10-CM | POA: Diagnosis not present

## 2022-09-20 DIAGNOSIS — K5900 Constipation, unspecified: Secondary | ICD-10-CM | POA: Diagnosis not present

## 2022-09-20 DIAGNOSIS — K625 Hemorrhage of anus and rectum: Secondary | ICD-10-CM | POA: Diagnosis not present

## 2022-09-20 DIAGNOSIS — K2270 Barrett's esophagus without dysplasia: Secondary | ICD-10-CM | POA: Diagnosis not present

## 2022-10-02 DIAGNOSIS — M9903 Segmental and somatic dysfunction of lumbar region: Secondary | ICD-10-CM | POA: Diagnosis not present

## 2022-10-02 DIAGNOSIS — M9905 Segmental and somatic dysfunction of pelvic region: Secondary | ICD-10-CM | POA: Diagnosis not present

## 2022-10-02 DIAGNOSIS — M9904 Segmental and somatic dysfunction of sacral region: Secondary | ICD-10-CM | POA: Diagnosis not present

## 2022-10-02 DIAGNOSIS — M5136 Other intervertebral disc degeneration, lumbar region: Secondary | ICD-10-CM | POA: Diagnosis not present

## 2022-10-09 DIAGNOSIS — M9904 Segmental and somatic dysfunction of sacral region: Secondary | ICD-10-CM | POA: Diagnosis not present

## 2022-10-09 DIAGNOSIS — M9905 Segmental and somatic dysfunction of pelvic region: Secondary | ICD-10-CM | POA: Diagnosis not present

## 2022-10-09 DIAGNOSIS — M9903 Segmental and somatic dysfunction of lumbar region: Secondary | ICD-10-CM | POA: Diagnosis not present

## 2022-10-09 DIAGNOSIS — M5136 Other intervertebral disc degeneration, lumbar region: Secondary | ICD-10-CM | POA: Diagnosis not present

## 2022-10-17 DIAGNOSIS — Z6824 Body mass index (BMI) 24.0-24.9, adult: Secondary | ICD-10-CM | POA: Diagnosis not present

## 2022-10-17 DIAGNOSIS — L089 Local infection of the skin and subcutaneous tissue, unspecified: Secondary | ICD-10-CM | POA: Diagnosis not present

## 2022-10-30 ENCOUNTER — Other Ambulatory Visit: Payer: Self-pay

## 2022-10-31 ENCOUNTER — Ambulatory Visit: Payer: 59 | Attending: Cardiology | Admitting: Cardiology

## 2022-10-31 ENCOUNTER — Encounter: Payer: Self-pay | Admitting: Cardiology

## 2022-10-31 VITALS — BP 102/56 | HR 78 | Ht 61.6 in | Wt 140.0 lb

## 2022-10-31 DIAGNOSIS — J431 Panlobular emphysema: Secondary | ICD-10-CM | POA: Diagnosis not present

## 2022-10-31 DIAGNOSIS — I10 Essential (primary) hypertension: Secondary | ICD-10-CM | POA: Diagnosis not present

## 2022-10-31 DIAGNOSIS — F17200 Nicotine dependence, unspecified, uncomplicated: Secondary | ICD-10-CM

## 2022-10-31 DIAGNOSIS — E782 Mixed hyperlipidemia: Secondary | ICD-10-CM | POA: Diagnosis not present

## 2022-10-31 DIAGNOSIS — I2510 Atherosclerotic heart disease of native coronary artery without angina pectoris: Secondary | ICD-10-CM

## 2022-10-31 NOTE — Progress Notes (Signed)
Cardiology Office Note:    Date:  10/31/2022   ID:  Shirley Williams, DOB 1956-02-01, MRN [id number]  PCP:  Lucianne Lei, MD  Cardiologist:  Garwin Brothers, MD   Referring MD: Lucianne Lei, MD    ASSESSMENT:    1. Essential hypertension   2. Panlobular emphysema (HCC)   3. Tobacco use disorder   4. Mixed hyperlipidemia   5. Atherosclerosis of native coronary artery of native heart without angina pectoris    PLAN:    In order of problems listed above:  Coronary artery disease: Secondary prevention discussed with the patient.  Importance of compliance with diet medication stressed and she vocalized understanding.  Once her wounds get better she plans to walk on a regular basis. Essential hypertension: Blood pressure is stable and diet was emphasized.  Lifestyle modification urged. Mixed dyslipidemia: On lipid-lowering medications.  Lipids were reviewed from PCP and she was informed to be fine. COPD: Managed by primary care.  Fortunately she has quit smoking 3 months ago and promises never to get back. Patient will be seen in follow-up appointment in 12 months or earlier if the patient has any concerns.   Medication Adjustments/Labs and Tests Ordered: Current medicines are reviewed at length with the patient today.  Concerns regarding medicines are outlined above.  No orders of the defined types were placed in this encounter.  No orders of the defined types were placed in this encounter.    No chief complaint on file.    History of Present Illness:    Shirley Williams is a 67 y.o. female.  Patient has past medical history of coronary atherosclerosis, essential hypertension mixed dyslipidemia COPD and history of smoking.  Fortunately she mentions to me that she quit smoking 3 months ago.  She denies any chest pain orthopnea or PND.  She has a wound on her right lower extremity which is taken care of by primary care.  She hurt herself on a piece of furniture.  It is bandaged and she  prefers not to take the bandage off  Past Medical History:  Diagnosis Date   Abnormal findings on diagnostic imaging of lung 08/13/2019   Acquired trigger finger of right middle finger 05/11/2019   Acute appendicitis 01/15/2012   Acute medial meniscus tear of left knee 05/19/2015   Acute paronychia of finger of left hand 01/13/2019   Aftercare following right knee joint replacement surgery 07/26/2015   Aftercare following surgery of the musculoskeletal system 07/20/2014   Amputation of third finger of left hand 07/20/2014   Anemia 05/13/2018   Anxiety 05/13/2018   Arthritis    Asthma 05/13/2018   Atelectasis of left middle ear 04/03/2016   Atrophic vaginitis 03/08/2020   Bilateral carpal tunnel syndrome 07/31/2018   Body mass index (bmi) 31.0-31.9, adult 03/12/2017   Chest discomfort 05/13/2018   Chronic Eustachian tube dysfunction, bilateral 12/07/2015   Chronic obstructive lung disease (HCC) 11/16/2019   Chronic pain syndrome 10/04/2015   [facility] DJD(carpometacarpal degenerative joint disease), localized primary 03/22/2015   Complication following left mastoidectomy 11/12/2019   Formatting of this note might be different from the original. Added automatically from request for surgery [id number]   Conductive hearing loss of left ear with restricted hearing of right ear 06/03/2020   COPD (chronic obstructive pulmonary disease) (HCC) 05/13/2018   COPD with chronic bronchitis and emphysema (HCC) 08/13/2019   Coronary atherosclerosis 08/12/2020   Depression    Depressive disorder 05/13/2018   Elevated blood-pressure reading, without diagnosis  of hypertension 10/07/2017   Essential hypertension 05/13/2018   ETD (Eustachian tube dysfunction), bilateral 12/07/2015   GERD (gastroesophageal reflux disease)    Hearing loss 01/18/2022   Hearing loss, mixed, bilateral 05/13/2018   Hiatal hernia 05/13/2018   History of fall 08/18/2020   History of left mastectomy 12/11/2021   History of  mastoidectomy 11/02/2019   History of tympanomastoidectomy 11/02/2019   Hyperlipidemia 05/13/2018   Impacted cerumen of left ear 11/17/2018   Internal derangement of left knee 09/17/2016   Knee effusion, right 03/22/2015   Lumbar radiculopathy 05/19/2015   Major depressive disorder, recurrent episode, in partial remission (HCC) 07/03/2016   MDD (major depressive disorder), recurrent, severe, with psychosis (HCC) 10/03/2015   Medication management 08/13/2019   Migraines 05/13/2018   Mixed conductive and sensorineural hearing loss of both ears 01/18/2022   Nail remnant of finger 11/03/2019   Ossicular chain disruption 01/20/2020   Osteomyelitis of finger of left hand (HCC) 02/03/2019   Osteoporosis 11/16/2019   Pain in both hands 09/19/2018   Panic disorder 07/03/2016   Perforation of left tympanic membrane 04/20/2021   Personality disorder (HCC) 07/03/2016   Primary osteoarthritis of both first carpometacarpal joints 12/13/2014   Primary osteoarthritis of both knees 01/23/2015   Primary osteoarthritis of left knee 03/06/2017   Formatting of this note might be different from the original. Added automatically from request for surgery [id number]   Pubic ramus fracture, right, closed, initial encounter (HCC) 10/27/2018   Retraction pocket of tympanic membrane of left ear 12/07/2015   Risk for falls 06/20/2015   Spondylolisthesis at L5-S1 level 02/03/2019   Status post total right knee replacement 07/23/2016   Tear of lateral meniscus of right knee, current 05/19/2015   Tinnitus, left 05/13/2018   Tobacco use disorder 08/13/2019   Vertigo 07/07/2020   Formatting of this note might be different from the original. Possibly BPPV.    Past Surgical History:  Procedure Laterality Date   APPENDECTOMY     LAPAROSCOPIC APPENDECTOMY  01/15/2012   Procedure: APPENDECTOMY LAPAROSCOPIC;  Surgeon: Wilmon Arms. Tsuei, MD;  Location: [facility] OR;  Service: General;  Laterality: N/A;   TONSILLECTOMY       Current Medications: Current Meds  Medication Sig   ALPRAZolam (XANAX) 0.5 MG tablet Take 0.5 mg by mouth 3 (three) times daily as needed for anxiety or sleep.   atorvastatin (LIPITOR) 80 MG tablet Take 80 mg by mouth daily.   baclofen (LIORESAL) 10 MG tablet Take 10 mg by mouth as needed for muscle spasms.   BELSOMRA 10 MG TABS Take 1 tablet by mouth at bedtime.   DULoxetine (CYMBALTA) 30 MG capsule Take 30 mg by mouth daily.   DULoxetine (CYMBALTA) 60 MG capsule Take 1 capsule by mouth daily. Plus 30 mg to make 90 mg daily   esomeprazole (NEXIUM) 40 MG capsule Take 40 mg by mouth daily at 12 noon.   fluticasone (FLONASE) 50 MCG/ACT nasal spray Place 2 sprays into both nostrils daily.   furosemide (LASIX) 20 MG tablet Take 20 mg by mouth daily as needed for fluid or edema.   lubiprostone (AMITIZA) 24 MCG capsule Take 24 mcg by mouth daily.   mirabegron ER (MYRBETRIQ) 50 MG TB24 tablet Take 50 mg by mouth daily.   PROAIR HFA 108 (90 Base) MCG/ACT inhaler INHALE 2 PUFFS EVERY 4-6 HOURS AS NEEDED   solifenacin (VESICARE) 10 MG tablet Take 10 mg by mouth daily.   topiramate (TOPAMAX) 25 MG tablet Take 25 mg by  mouth 2 (two) times daily.   traZODone (DESYREL) 100 MG tablet Take 100 mg by mouth at bedtime.   varenicline (CHANTIX) 1 MG tablet Take 1 mg by mouth 2 (two) times daily.   Vitamin D, Ergocalciferol, (DRISDOL) 1.25 MG (50000 UNIT) CAPS capsule Take 50,000 Units by mouth once a week.     Allergies:   Sulfa antibiotics, Sulfacetamide sodium, Tiotropium, Sulfabenzamide, and Tape   Social History   Socioeconomic History   Marital status: Married    Spouse name: Not on file   Number of children: Not on file   Years of education: Not on file   Highest education level: Not on file  Occupational History   Not on file  Tobacco Use   Smoking status: Every Day    Current packs/day: 0.25    Average packs/day: 0.3 packs/day for 36.0 years (9.0 ttl pk-yrs)    Types: Cigarettes    Smokeless tobacco: Never   Tobacco comments:    1-2 cigarettes smoked weekly 05/18/20 [facility]   Vaping Use   Vaping status: Never Used  Substance and Sexual Activity   Alcohol use: Yes    Comment: occ   Drug use: No   Sexual activity: Yes    Birth control/protection: Post-menopausal  Other Topics Concern   Not on file  Social History Narrative   Not on file   Social Determinants of Health   Financial Resource Strain: Not on file  Food Insecurity: Not on file  Transportation Needs: Not on file  Physical Activity: Not on file  Stress: Not on file  Social Connections: Not on file     Family History: The patient's family history includes Asthma in her sister; COPD in her mother; Cancer in her father; Depression in her sister.  ROS:   Please see the history of present illness.    All other systems reviewed and are negative.  EKGs/Labs/Other Studies Reviewed:    The following studies were reviewed today: I discussed my findings with the patient at length   Recent Labs: No results found for requested labs within last 365 days.  Recent Lipid Panel    Component Value Date/Time   CHOL 223 (H) 10/05/2015 0627   TRIG 175 (H) 10/05/2015 0627   HDL 65 10/05/2015 0627   CHOLHDL 3.4 10/05/2015 0627   VLDL 35 10/05/2015 0627   LDLCALC 123 (H) 10/05/2015 0627    Physical Exam:    VS:  BP (!) 102/56   Pulse 78   Ht 5' 1.6" (1.565 m)   Wt 140 lb 0.6 oz (63.5 kg)   LMP 12/04/2011   SpO2 95%   BMI 25.95 kg/m     Wt Readings from Last 3 Encounters:  10/31/22 140 lb 0.6 oz (63.5 kg)  01/22/22 152 lb 9.6 oz (69.2 kg)  08/12/20 177 lb 3.2 oz (80.4 kg)     GEN: Patient is in no acute distress HEENT: Normal NECK: No JVD; No carotid bruits LYMPHATICS: No lymphadenopathy CARDIAC: Hear sounds regular, 2/6 systolic murmur at the apex. RESPIRATORY:  Clear to auscultation without rales, wheezing or rhonchi  ABDOMEN: Soft, non-tender, non-distended MUSCULOSKELETAL:  No edema; No  deformity  SKIN: Warm and dry NEUROLOGIC:  Alert and oriented x 3 PSYCHIATRIC:  Normal affect   Signed, Garwin Brothers, MD  10/31/2022 1:14 PM    [facility]

## 2022-10-31 NOTE — Patient Instructions (Signed)

## 2022-11-21 DIAGNOSIS — F17200 Nicotine dependence, unspecified, uncomplicated: Secondary | ICD-10-CM | POA: Diagnosis not present

## 2022-11-21 DIAGNOSIS — J309 Allergic rhinitis, unspecified: Secondary | ICD-10-CM | POA: Diagnosis not present

## 2022-11-21 DIAGNOSIS — L819 Disorder of pigmentation, unspecified: Secondary | ICD-10-CM | POA: Diagnosis not present

## 2022-11-21 DIAGNOSIS — I10 Essential (primary) hypertension: Secondary | ICD-10-CM | POA: Diagnosis not present

## 2022-11-21 DIAGNOSIS — E782 Mixed hyperlipidemia: Secondary | ICD-10-CM | POA: Diagnosis not present

## 2022-11-21 DIAGNOSIS — J449 Chronic obstructive pulmonary disease, unspecified: Secondary | ICD-10-CM | POA: Diagnosis not present

## 2022-11-21 DIAGNOSIS — E559 Vitamin D deficiency, unspecified: Secondary | ICD-10-CM | POA: Diagnosis not present

## 2022-11-21 DIAGNOSIS — Z8719 Personal history of other diseases of the digestive system: Secondary | ICD-10-CM | POA: Diagnosis not present

## 2022-11-21 DIAGNOSIS — M1990 Unspecified osteoarthritis, unspecified site: Secondary | ICD-10-CM | POA: Diagnosis not present

## 2022-11-21 DIAGNOSIS — K219 Gastro-esophageal reflux disease without esophagitis: Secondary | ICD-10-CM | POA: Diagnosis not present

## 2022-11-21 DIAGNOSIS — Z6824 Body mass index (BMI) 24.0-24.9, adult: Secondary | ICD-10-CM | POA: Diagnosis not present

## 2022-11-21 DIAGNOSIS — I700 Atherosclerosis of aorta: Secondary | ICD-10-CM | POA: Diagnosis not present

## 2022-11-27 DIAGNOSIS — M79642 Pain in left hand: Secondary | ICD-10-CM | POA: Diagnosis not present

## 2022-11-27 DIAGNOSIS — M65341 Trigger finger, right ring finger: Secondary | ICD-10-CM | POA: Diagnosis not present

## 2022-11-27 DIAGNOSIS — M1812 Unilateral primary osteoarthritis of first carpometacarpal joint, left hand: Secondary | ICD-10-CM | POA: Diagnosis not present

## 2022-11-27 DIAGNOSIS — M65331 Trigger finger, right middle finger: Secondary | ICD-10-CM | POA: Diagnosis not present

## 2022-12-03 DIAGNOSIS — M5136 Other intervertebral disc degeneration, lumbar region: Secondary | ICD-10-CM | POA: Diagnosis not present

## 2022-12-03 DIAGNOSIS — M9905 Segmental and somatic dysfunction of pelvic region: Secondary | ICD-10-CM | POA: Diagnosis not present

## 2022-12-03 DIAGNOSIS — M9904 Segmental and somatic dysfunction of sacral region: Secondary | ICD-10-CM | POA: Diagnosis not present

## 2022-12-03 DIAGNOSIS — M9903 Segmental and somatic dysfunction of lumbar region: Secondary | ICD-10-CM | POA: Diagnosis not present

## 2022-12-11 DIAGNOSIS — L089 Local infection of the skin and subcutaneous tissue, unspecified: Secondary | ICD-10-CM | POA: Diagnosis not present

## 2022-12-11 DIAGNOSIS — Z6824 Body mass index (BMI) 24.0-24.9, adult: Secondary | ICD-10-CM | POA: Diagnosis not present

## 2023-01-14 DIAGNOSIS — L981 Factitial dermatitis: Secondary | ICD-10-CM | POA: Diagnosis not present

## 2023-02-12 DIAGNOSIS — M19041 Primary osteoarthritis, right hand: Secondary | ICD-10-CM | POA: Diagnosis not present

## 2023-02-12 DIAGNOSIS — M1811 Unilateral primary osteoarthritis of first carpometacarpal joint, right hand: Secondary | ICD-10-CM | POA: Diagnosis not present

## 2023-03-07 DIAGNOSIS — J019 Acute sinusitis, unspecified: Secondary | ICD-10-CM | POA: Diagnosis not present

## 2023-03-18 ENCOUNTER — Encounter: Payer: Self-pay | Admitting: Acute Care

## 2023-03-18 DIAGNOSIS — E782 Mixed hyperlipidemia: Secondary | ICD-10-CM | POA: Diagnosis not present

## 2023-03-18 DIAGNOSIS — M7989 Other specified soft tissue disorders: Secondary | ICD-10-CM | POA: Diagnosis not present

## 2023-03-18 DIAGNOSIS — F17200 Nicotine dependence, unspecified, uncomplicated: Secondary | ICD-10-CM | POA: Diagnosis not present

## 2023-03-18 DIAGNOSIS — J449 Chronic obstructive pulmonary disease, unspecified: Secondary | ICD-10-CM | POA: Diagnosis not present

## 2023-03-18 DIAGNOSIS — E559 Vitamin D deficiency, unspecified: Secondary | ICD-10-CM | POA: Diagnosis not present

## 2023-03-18 DIAGNOSIS — K219 Gastro-esophageal reflux disease without esophagitis: Secondary | ICD-10-CM | POA: Diagnosis not present

## 2023-03-18 DIAGNOSIS — I700 Atherosclerosis of aorta: Secondary | ICD-10-CM | POA: Diagnosis not present

## 2023-03-18 DIAGNOSIS — J309 Allergic rhinitis, unspecified: Secondary | ICD-10-CM | POA: Diagnosis not present

## 2023-03-18 DIAGNOSIS — Z8719 Personal history of other diseases of the digestive system: Secondary | ICD-10-CM | POA: Diagnosis not present

## 2023-03-18 DIAGNOSIS — L819 Disorder of pigmentation, unspecified: Secondary | ICD-10-CM | POA: Diagnosis not present

## 2023-03-18 DIAGNOSIS — I10 Essential (primary) hypertension: Secondary | ICD-10-CM | POA: Diagnosis not present

## 2023-03-18 DIAGNOSIS — M1990 Unspecified osteoarthritis, unspecified site: Secondary | ICD-10-CM | POA: Diagnosis not present

## 2023-03-26 IMAGING — CT CT CHEST LUNG CANCER SCREENING LOW DOSE W/O CM
2 of 5 series · 15 of 40 positions shown, 18 images · non-contrast
Comparison: 06/08/2019 screening chest CT.

CLINICAL DATA: 64-year-old asymptomatic female current smoker with
37 pack-year smoking history.

EXAM:
CT CHEST WITHOUT CONTRAST LOW-DOSE FOR LUNG CANCER SCREENING
TECHNIQUE: Multidetector CT imaging of the chest was performed following the
standard protocol without IV contrast.

[Series 4: lung 1.00 br44 cor · coronal · 0.58mm/px · 3 of 329 slices shown]
[im 66/329  lung]
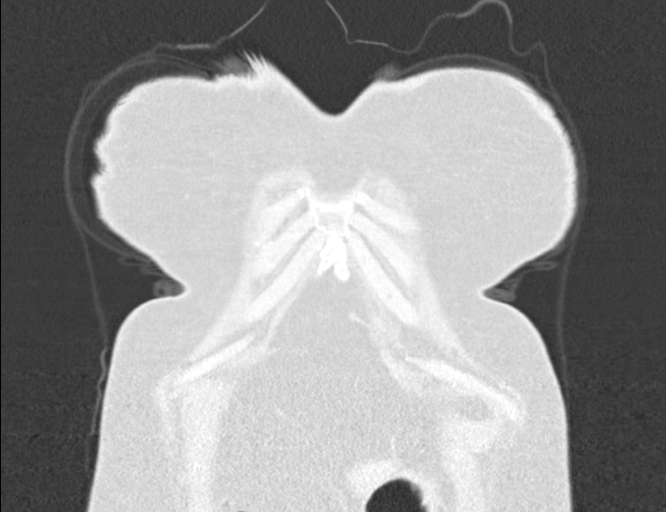
[im 132/329  lung]
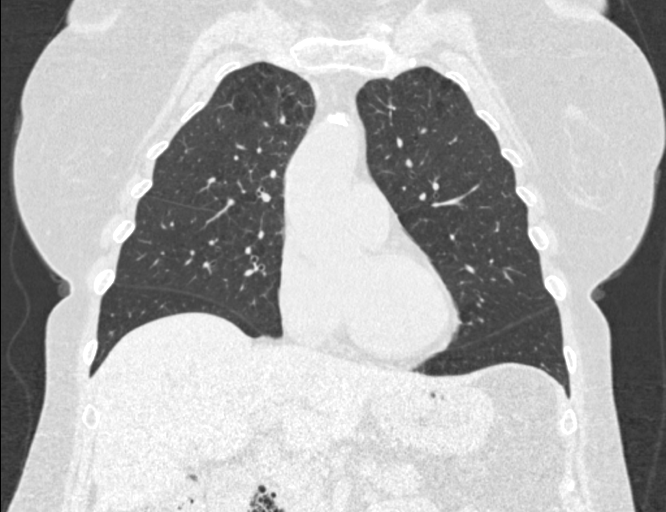
[im 197/329  lung]
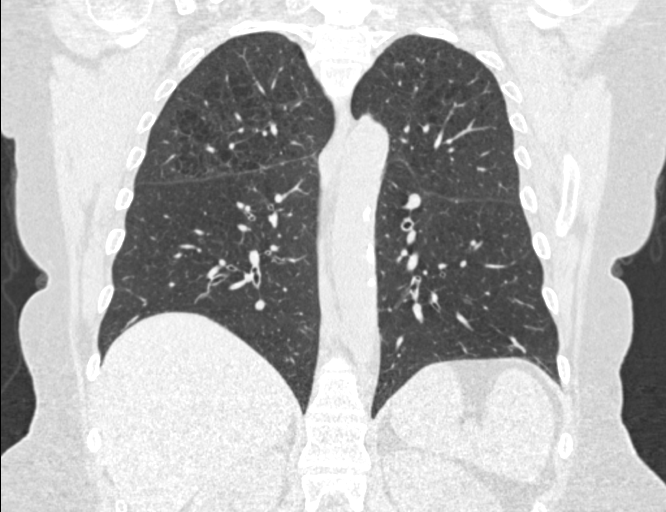

[Series 9: lung 1.00 br60 axial · axial · 0.76mm/px · z∈[-1083,-816]mm · 12 of 297 slices shown, 15 images]
[im 15/297  mediastinal]
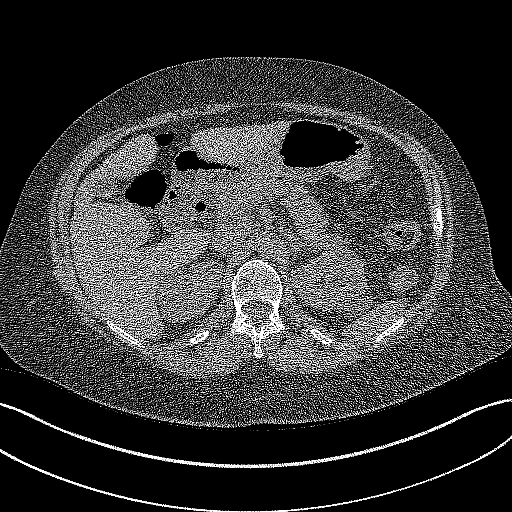
[im 15/297  lung]
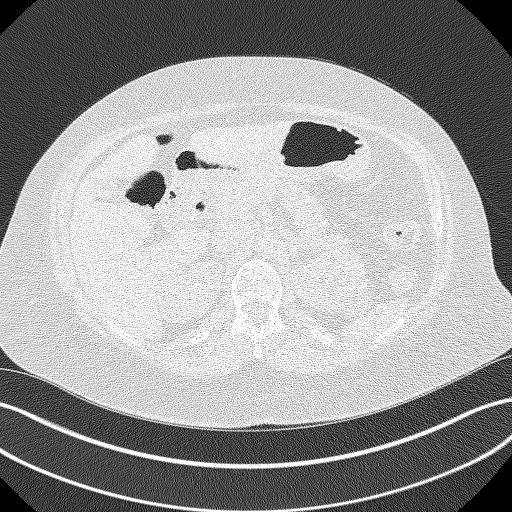
[im 43/297  lung]
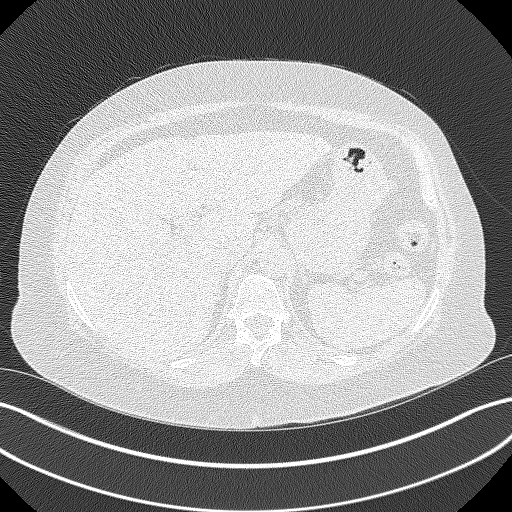
[im 71/297  lung]
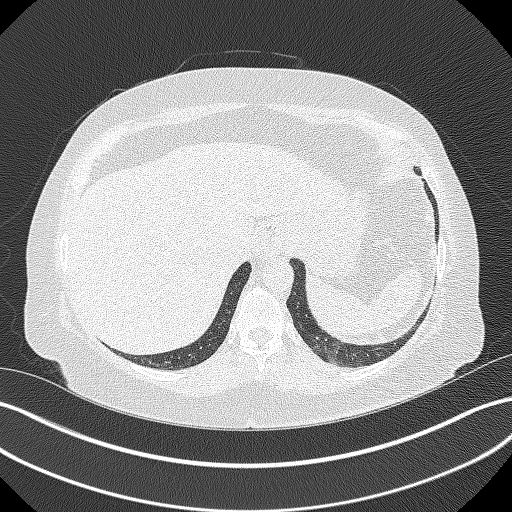
[im 85/297  lung]
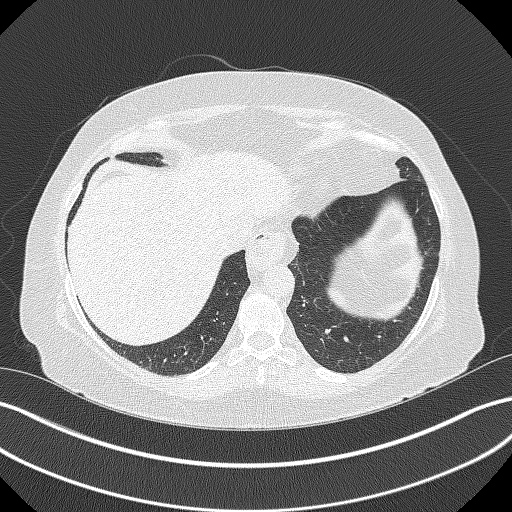
[im 113/297  mediastinal]
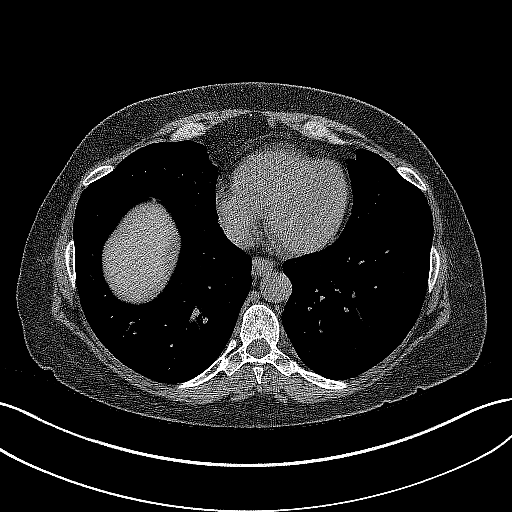
[im 113/297  lung]
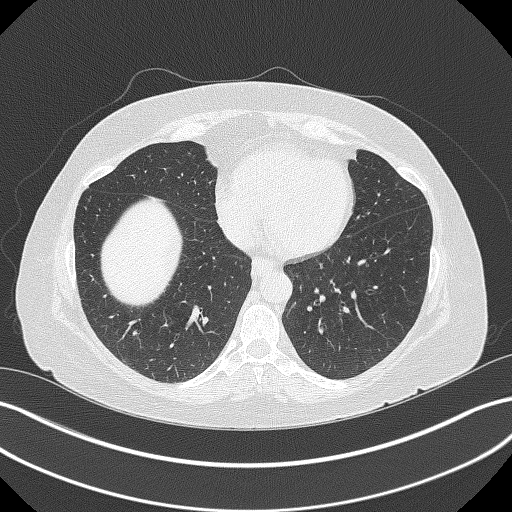
[im 141/297  lung]
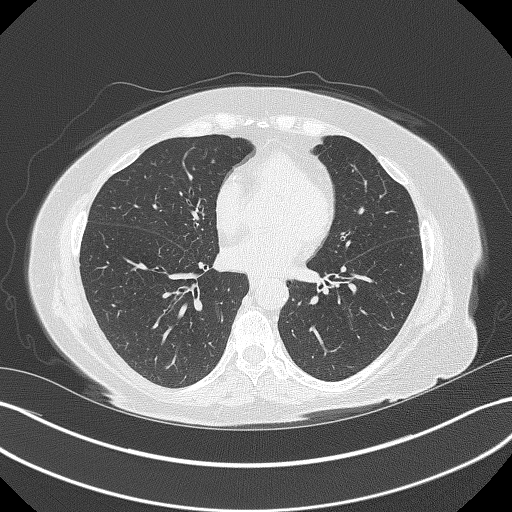
[im 156/297  lung]
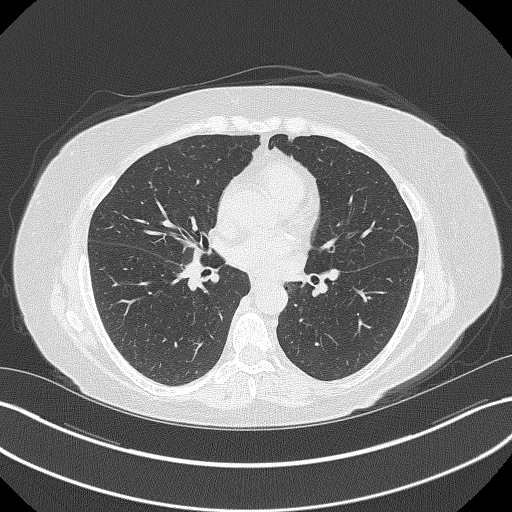
[im 184/297  lung]
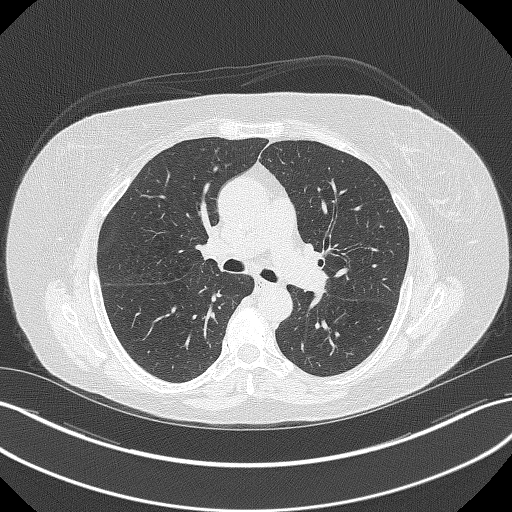
[im 212/297  mediastinal]
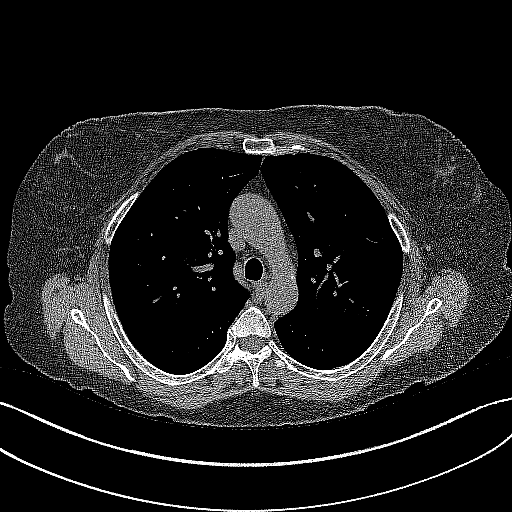
[im 212/297  lung]
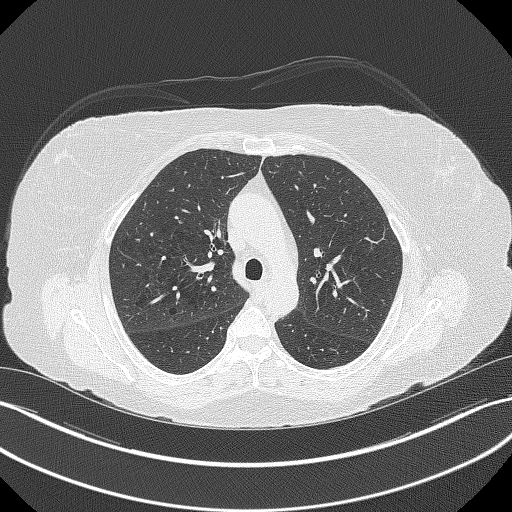
[im 226/297  lung]
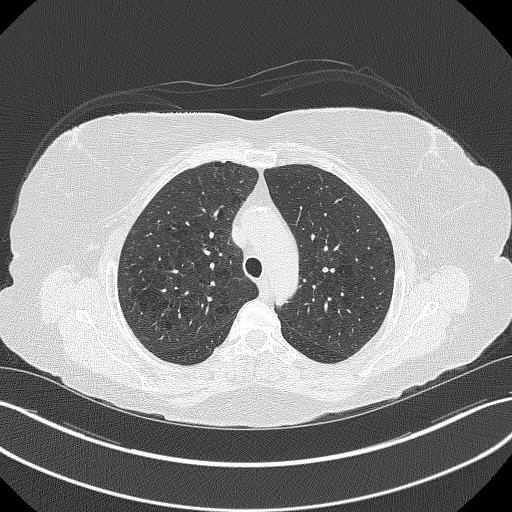
[im 254/297  lung]
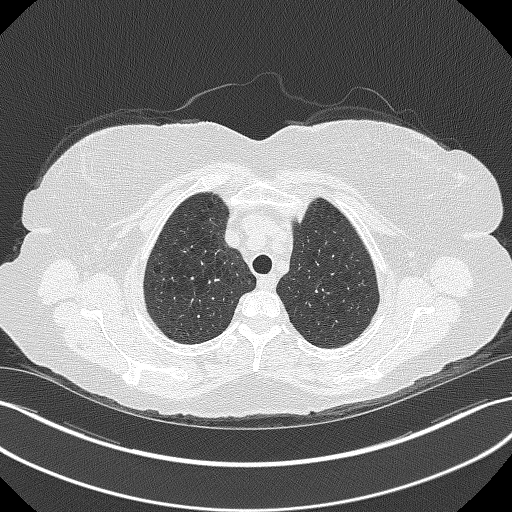
[im 282/297  lung]
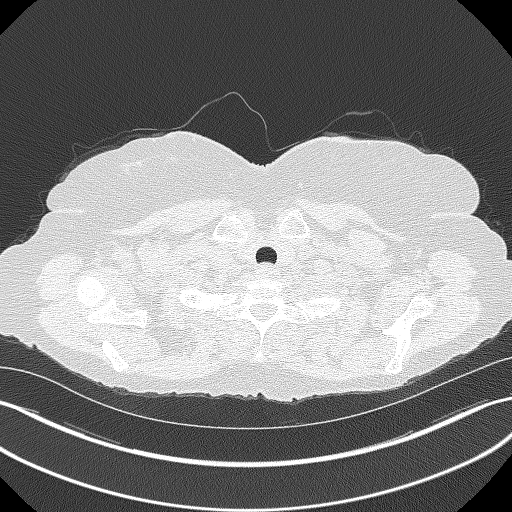

[15 of 40 positions shown; findings below may reference images not displayed]

FINDINGS: Cardiovascular: Normal heart size. No significant pericardial
effusion/thickening. Three-vessel coronary atherosclerosis.
Atherosclerotic nonaneurysmal thoracic aorta. Normal caliber
pulmonary arteries.

Mediastinum/Nodes: No discrete thyroid nodules. Unremarkable
esophagus. No pathologically enlarged axillary, mediastinal or hilar
lymph nodes, noting limited sensitivity for the detection of hilar
adenopathy on this noncontrast study.

Lungs/Pleura: No pneumothorax. No pleural effusion. Moderate
centrilobular and paraseptal emphysema with diffuse bronchial wall
thickening. No acute consolidative airspace disease or lung masses.
No significant growth of previously visualized scattered right
pulmonary nodules. No new significant pulmonary nodules.

Upper abdomen: Small hiatal hernia. Cholelithiasis. Simple 4.1 cm
lateral upper left renal cyst.

Musculoskeletal: No aggressive appearing focal osseous lesions.
Moderate thoracic spondylosis.
IMPRESSION: 1. Lung-RADS 2, benign appearance or behavior. Continue annual
screening with low-dose chest CT without contrast in 12 months.
2. Three-vessel coronary atherosclerosis.
3. Small hiatal hernia.
4. Cholelithiasis.
5. Aortic Atherosclerosis (D8ZXN-S1B.B) and Emphysema (D8ZXN-8OQ.6).

## 2023-04-18 DIAGNOSIS — M25572 Pain in left ankle and joints of left foot: Secondary | ICD-10-CM | POA: Diagnosis not present

## 2023-06-17 DIAGNOSIS — E782 Mixed hyperlipidemia: Secondary | ICD-10-CM | POA: Diagnosis not present

## 2023-06-17 DIAGNOSIS — L819 Disorder of pigmentation, unspecified: Secondary | ICD-10-CM | POA: Diagnosis not present

## 2023-06-17 DIAGNOSIS — E559 Vitamin D deficiency, unspecified: Secondary | ICD-10-CM | POA: Diagnosis not present

## 2023-06-17 DIAGNOSIS — Z8719 Personal history of other diseases of the digestive system: Secondary | ICD-10-CM | POA: Diagnosis not present

## 2023-06-17 DIAGNOSIS — M1990 Unspecified osteoarthritis, unspecified site: Secondary | ICD-10-CM | POA: Diagnosis not present

## 2023-06-17 DIAGNOSIS — I700 Atherosclerosis of aorta: Secondary | ICD-10-CM | POA: Diagnosis not present

## 2023-06-17 DIAGNOSIS — K219 Gastro-esophageal reflux disease without esophagitis: Secondary | ICD-10-CM | POA: Diagnosis not present

## 2023-06-17 DIAGNOSIS — Z8669 Personal history of other diseases of the nervous system and sense organs: Secondary | ICD-10-CM | POA: Diagnosis not present

## 2023-06-17 DIAGNOSIS — M7989 Other specified soft tissue disorders: Secondary | ICD-10-CM | POA: Diagnosis not present

## 2023-06-17 DIAGNOSIS — J309 Allergic rhinitis, unspecified: Secondary | ICD-10-CM | POA: Diagnosis not present

## 2023-06-17 DIAGNOSIS — I10 Essential (primary) hypertension: Secondary | ICD-10-CM | POA: Diagnosis not present

## 2023-10-03 DIAGNOSIS — M7989 Other specified soft tissue disorders: Secondary | ICD-10-CM | POA: Diagnosis not present

## 2023-10-03 DIAGNOSIS — R739 Hyperglycemia, unspecified: Secondary | ICD-10-CM | POA: Diagnosis not present

## 2023-10-03 DIAGNOSIS — Z79899 Other long term (current) drug therapy: Secondary | ICD-10-CM | POA: Diagnosis not present

## 2023-10-03 DIAGNOSIS — E559 Vitamin D deficiency, unspecified: Secondary | ICD-10-CM | POA: Diagnosis not present

## 2023-10-03 DIAGNOSIS — L819 Disorder of pigmentation, unspecified: Secondary | ICD-10-CM | POA: Diagnosis not present

## 2023-10-03 DIAGNOSIS — K219 Gastro-esophageal reflux disease without esophagitis: Secondary | ICD-10-CM | POA: Diagnosis not present

## 2023-10-03 DIAGNOSIS — Z6822 Body mass index (BMI) 22.0-22.9, adult: Secondary | ICD-10-CM | POA: Diagnosis not present

## 2023-10-03 DIAGNOSIS — M1990 Unspecified osteoarthritis, unspecified site: Secondary | ICD-10-CM | POA: Diagnosis not present

## 2023-10-03 DIAGNOSIS — E782 Mixed hyperlipidemia: Secondary | ICD-10-CM | POA: Diagnosis not present

## 2023-10-03 DIAGNOSIS — Z8719 Personal history of other diseases of the digestive system: Secondary | ICD-10-CM | POA: Diagnosis not present

## 2023-10-03 DIAGNOSIS — I10 Essential (primary) hypertension: Secondary | ICD-10-CM | POA: Diagnosis not present

## 2023-10-03 DIAGNOSIS — J309 Allergic rhinitis, unspecified: Secondary | ICD-10-CM | POA: Diagnosis not present

## 2023-10-03 DIAGNOSIS — I700 Atherosclerosis of aorta: Secondary | ICD-10-CM | POA: Diagnosis not present

## 2023-10-03 DIAGNOSIS — Z8669 Personal history of other diseases of the nervous system and sense organs: Secondary | ICD-10-CM | POA: Diagnosis not present

## 2024-01-01 DIAGNOSIS — K219 Gastro-esophageal reflux disease without esophagitis: Secondary | ICD-10-CM | POA: Diagnosis not present

## 2024-01-01 DIAGNOSIS — E559 Vitamin D deficiency, unspecified: Secondary | ICD-10-CM | POA: Diagnosis not present

## 2024-01-01 DIAGNOSIS — M1990 Unspecified osteoarthritis, unspecified site: Secondary | ICD-10-CM | POA: Diagnosis not present

## 2024-01-01 DIAGNOSIS — Z6822 Body mass index (BMI) 22.0-22.9, adult: Secondary | ICD-10-CM | POA: Diagnosis not present

## 2024-01-01 DIAGNOSIS — M7989 Other specified soft tissue disorders: Secondary | ICD-10-CM | POA: Diagnosis not present

## 2024-01-01 DIAGNOSIS — Z8719 Personal history of other diseases of the digestive system: Secondary | ICD-10-CM | POA: Diagnosis not present

## 2024-01-01 DIAGNOSIS — Z23 Encounter for immunization: Secondary | ICD-10-CM | POA: Diagnosis not present

## 2024-01-01 DIAGNOSIS — J309 Allergic rhinitis, unspecified: Secondary | ICD-10-CM | POA: Diagnosis not present

## 2024-01-01 DIAGNOSIS — J449 Chronic obstructive pulmonary disease, unspecified: Secondary | ICD-10-CM | POA: Diagnosis not present

## 2024-01-01 DIAGNOSIS — I10 Essential (primary) hypertension: Secondary | ICD-10-CM | POA: Diagnosis not present

## 2024-01-01 DIAGNOSIS — E782 Mixed hyperlipidemia: Secondary | ICD-10-CM | POA: Diagnosis not present

## 2024-01-01 DIAGNOSIS — L819 Disorder of pigmentation, unspecified: Secondary | ICD-10-CM | POA: Diagnosis not present
